# Patient Record
Sex: Male | Born: 1975
Health system: Southern US, Community
[De-identification: ages and names within clinical notes are randomized; demographics above are authoritative.]

## PROBLEM LIST (undated history)

## (undated) DIAGNOSIS — Z789 Other specified health status: Secondary | ICD-10-CM

## (undated) DIAGNOSIS — K259 Gastric ulcer, unspecified as acute or chronic, without hemorrhage or perforation: Secondary | ICD-10-CM

## (undated) HISTORY — PX: OTHER SURGICAL HISTORY: SHX169

## (undated) HISTORY — PX: NO PAST SURGERIES: SHX2092

---

## 1999-01-05 ENCOUNTER — Emergency Department (HOSPITAL_COMMUNITY): Admission: EM | Admit: 1999-01-05 | Discharge: 1999-01-05 | Payer: Self-pay | Admitting: Emergency Medicine

## 2002-04-03 ENCOUNTER — Emergency Department (HOSPITAL_COMMUNITY): Admission: EM | Admit: 2002-04-03 | Discharge: 2002-04-04 | Payer: Self-pay | Admitting: Emergency Medicine

## 2006-08-19 ENCOUNTER — Emergency Department (HOSPITAL_COMMUNITY): Admission: EM | Admit: 2006-08-19 | Discharge: 2006-08-20 | Payer: Self-pay | Admitting: Emergency Medicine

## 2007-02-17 ENCOUNTER — Emergency Department (HOSPITAL_COMMUNITY): Admission: EM | Admit: 2007-02-17 | Discharge: 2007-02-17 | Payer: Self-pay | Admitting: Emergency Medicine

## 2007-12-28 ENCOUNTER — Emergency Department (HOSPITAL_COMMUNITY): Admission: EM | Admit: 2007-12-28 | Discharge: 2007-12-28 | Payer: Self-pay | Admitting: Emergency Medicine

## 2008-04-07 ENCOUNTER — Emergency Department (HOSPITAL_COMMUNITY): Admission: EM | Admit: 2008-04-07 | Discharge: 2008-04-07 | Payer: Self-pay | Admitting: Emergency Medicine

## 2011-07-05 LAB — POCT I-STAT, CHEM 8
BUN: 8
Calcium, Ion: 1.17
Chloride: 102
Creatinine, Ser: 1.2
Glucose, Bld: 110 — ABNORMAL HIGH
HCT: 53 — ABNORMAL HIGH
Hemoglobin: 18 — ABNORMAL HIGH
Potassium: 4.5
Sodium: 136
TCO2: 28

## 2012-11-04 ENCOUNTER — Encounter (HOSPITAL_COMMUNITY): Payer: Self-pay | Admitting: *Deleted

## 2012-11-04 ENCOUNTER — Emergency Department (HOSPITAL_COMMUNITY)
Admission: EM | Admit: 2012-11-04 | Discharge: 2012-11-04 | Disposition: A | Discharge status: Home / Self Care | Payer: Self-pay | Attending: Emergency Medicine | Admitting: Emergency Medicine

## 2012-11-04 DIAGNOSIS — K0889 Other specified disorders of teeth and supporting structures: Secondary | ICD-10-CM

## 2012-11-04 DIAGNOSIS — K089 Disorder of teeth and supporting structures, unspecified: Secondary | ICD-10-CM | POA: Insufficient documentation

## 2012-11-04 DIAGNOSIS — F17200 Nicotine dependence, unspecified, uncomplicated: Secondary | ICD-10-CM | POA: Insufficient documentation

## 2012-11-04 DIAGNOSIS — K029 Dental caries, unspecified: Secondary | ICD-10-CM | POA: Insufficient documentation

## 2012-11-04 MED ORDER — OXYCODONE-ACETAMINOPHEN 5-325 MG PO TABS: 1.0000 | ORAL_TABLET | Freq: Four times a day (QID) | ORAL | Status: DC | PRN

## 2012-11-04 NOTE — ED Provider Notes (Signed)
History  Scribed for Aaron Gourd, PA-C/ Juliet Rude. Pickering, MD, the patient was seen in room WTR7/WTR7. This chart was scribed by Candelaria Stagers. The patient's care started at 3:19 PM   CSN: [id number]  Arrival date & time 11/04/12  1438   First MD Initiated Contact with Patient 11/04/12 1513      Chief Complaint  Patient presents with  . Dental Pain     The history is provided by the patient. No language interpreter was used.   Aaron Walker is a 37 y.o. male who presents to the Emergency Department complaining of sudden onset of upper right dental pain that started yesterday and has gotten worse today. He describes the pain as a throbbing pain rated 10/10.  He denies fever, chills, nausea, or vomiting.  Pt is currently shaking which he reports is due to the pain.  Tried taking advil with only temporary relief yesterday. Gargling with water provides short relief.  Cold air makes the pain worse.  Pt does not have a dentist.       History reviewed. No pertinent past medical history.  Past Surgical History  Procedure Date  . None     History reviewed. No pertinent family history.  History  Substance Use Topics  . Smoking status: Current Every Day Smoker -- 0.5 packs/day    Types: Cigarettes  . Smokeless tobacco: Never Used  . Alcohol Use: Yes     Comment: weekends      Review of Systems  Constitutional: Negative for fever and chills.  HENT: Positive for dental problem (right upper dental pain).   Respiratory: Negative for shortness of breath.   Cardiovascular: Negative for chest pain and leg swelling.  Gastrointestinal: Negative for nausea and vomiting.  Genitourinary: Negative for dysuria and difficulty urinating.  Neurological: Negative for weakness.  All other systems reviewed and are negative.    Allergies  Review of patient's allergies indicates no known allergies.  Home Medications  No current outpatient prescriptions on file.  BP 179/87  Pulse 122   Temp 98.1 F (36.7 C) (Oral)  Resp 20  Wt 130 lb (58.968 kg)  SpO2 100%  Physical Exam  Nursing note and vitals reviewed. Constitutional: He is oriented to person, place, and time. He appears well-developed and well-nourished. No distress.  HENT:  Head: Normocephalic and atraumatic.  Right Ear: Tympanic membrane normal.  Left Ear: Tympanic membrane normal.  Mouth/Throat: Uvula is midline and oropharynx is clear and moist. Abnormal dentition. Dental caries present. No dental abscesses.       Right facial tenderness on palpation. Poor dentition.  No appreciable decay, mild cavity to the right upper last molar. No evidence of abscess.  Eyes: Conjunctivae normal and EOM are normal.  Neck: Normal range of motion. Neck supple. No tracheal deviation present.  Cardiovascular: Normal rate, regular rhythm and normal heart sounds.   Pulmonary/Chest: Effort normal and breath sounds normal. No respiratory distress. He has no wheezes. He has no rales.  Abdominal: Soft. Bowel sounds are normal. There is no tenderness.  Musculoskeletal: Normal range of motion.  Lymphadenopathy:       Head (right side): No submental, no submandibular and no tonsillar adenopathy present.       Head (left side): No submental, no submandibular and no tonsillar adenopathy present.  Neurological: He is alert and oriented to person, place, and time.  Skin: Skin is warm and dry.  Psychiatric: His behavior is normal. His mood appears anxious.  [facility]    ED Course  Procedures   Dental block. 1.5 cc 1% lidocaine into right upper gingiva.  DIAGNOSTIC STUDIES: Oxygen Saturation is 100% on room air, normal by my interpretation.    COORDINATION OF CARE:  3:28 PM Will administer nerve block to the right upper molar.  Advised pt to follow up with a dentist.  Pt understands and agrees.  4:15PM Dental Block administered.  Pt tolerated the procedure with no problems.    Labs Reviewed - No data to display No results  found.   1. Pain, dental       MDM  37 y/o male with dental pain without infection. Dental block administered. Rx percocet #10. Number for dental f/u given. Return precautions discussed. Patient states understanding of plan and is agreeable.   I personally performed the services described in this documentation, which was scribed in my presence. The recorded information has been reviewed and is accurate.        Trevor Mace, PA-C 11/04/12 1624

## 2012-11-04 NOTE — ED Notes (Signed)
Pt from home with reports of dental pain in the right upper area that radiates to right neck that started yesterday.

## 2012-11-04 NOTE — ED Provider Notes (Signed)
Medical screening examination/treatment/procedure(s) were performed by non-physician practitioner and as supervising physician I was immediately available for consultation/collaboration.  Quindarius Cabello R. Sloan Takagi, MD 11/04/12 2221 

## 2017-01-20 ENCOUNTER — Emergency Department (HOSPITAL_COMMUNITY): Payer: Self-pay

## 2017-01-20 ENCOUNTER — Inpatient Hospital Stay (HOSPITAL_COMMUNITY)
Admission: EM | Admit: 2017-01-20 | Discharge: 2017-01-24 | DRG: 378 | Disposition: A | Discharge status: Home / Self Care | Payer: Self-pay | Attending: Internal Medicine | Admitting: Internal Medicine

## 2017-01-20 ENCOUNTER — Encounter (HOSPITAL_COMMUNITY): Payer: Self-pay | Admitting: *Deleted

## 2017-01-20 DIAGNOSIS — K922 Gastrointestinal hemorrhage, unspecified: Secondary | ICD-10-CM | POA: Diagnosis present

## 2017-01-20 DIAGNOSIS — R000 Tachycardia, unspecified: Secondary | ICD-10-CM | POA: Diagnosis present

## 2017-01-20 DIAGNOSIS — K0889 Other specified disorders of teeth and supporting structures: Secondary | ICD-10-CM | POA: Diagnosis present

## 2017-01-20 DIAGNOSIS — Z7982 Long term (current) use of aspirin: Secondary | ICD-10-CM

## 2017-01-20 DIAGNOSIS — G8929 Other chronic pain: Secondary | ICD-10-CM | POA: Diagnosis present

## 2017-01-20 DIAGNOSIS — D649 Anemia, unspecified: Secondary | ICD-10-CM

## 2017-01-20 DIAGNOSIS — Y92002 Bathroom of unspecified non-institutional (private) residence single-family (private) house as the place of occurrence of the external cause: Secondary | ICD-10-CM

## 2017-01-20 DIAGNOSIS — K920 Hematemesis: Secondary | ICD-10-CM | POA: Diagnosis present

## 2017-01-20 DIAGNOSIS — B9681 Helicobacter pylori [H. pylori] as the cause of diseases classified elsewhere: Secondary | ICD-10-CM | POA: Diagnosis present

## 2017-01-20 DIAGNOSIS — F17210 Nicotine dependence, cigarettes, uncomplicated: Secondary | ICD-10-CM | POA: Diagnosis present

## 2017-01-20 DIAGNOSIS — W19XXXA Unspecified fall, initial encounter: Secondary | ICD-10-CM | POA: Diagnosis present

## 2017-01-20 DIAGNOSIS — K2970 Gastritis, unspecified, without bleeding: Secondary | ICD-10-CM | POA: Diagnosis present

## 2017-01-20 DIAGNOSIS — R739 Hyperglycemia, unspecified: Secondary | ICD-10-CM | POA: Diagnosis present

## 2017-01-20 DIAGNOSIS — R61 Generalized hyperhidrosis: Secondary | ICD-10-CM | POA: Diagnosis present

## 2017-01-20 DIAGNOSIS — Z791 Long term (current) use of non-steroidal anti-inflammatories (NSAID): Secondary | ICD-10-CM

## 2017-01-20 DIAGNOSIS — R55 Syncope and collapse: Secondary | ICD-10-CM | POA: Diagnosis present

## 2017-01-20 DIAGNOSIS — K264 Chronic or unspecified duodenal ulcer with hemorrhage: Principal | ICD-10-CM | POA: Diagnosis present

## 2017-01-20 DIAGNOSIS — K921 Melena: Secondary | ICD-10-CM | POA: Diagnosis present

## 2017-01-20 DIAGNOSIS — Z833 Family history of diabetes mellitus: Secondary | ICD-10-CM

## 2017-01-20 DIAGNOSIS — F1010 Alcohol abuse, uncomplicated: Secondary | ICD-10-CM | POA: Diagnosis present

## 2017-01-20 DIAGNOSIS — D62 Acute posthemorrhagic anemia: Secondary | ICD-10-CM | POA: Diagnosis present

## 2017-01-20 HISTORY — DX: Other specified health status: Z78.9

## 2017-01-20 LAB — CBC WITH DIFFERENTIAL/PLATELET
Basophils Absolute: 0 10*3/uL (ref 0.0–0.1)
Basophils Relative: 0 %
Eosinophils Absolute: 0 10*3/uL (ref 0.0–0.7)
Eosinophils Relative: 0 %
HCT: 20.3 % — ABNORMAL LOW (ref 39.0–52.0)
Hemoglobin: 7.1 g/dL — ABNORMAL LOW (ref 13.0–17.0)
Lymphocytes Relative: 23 %
Lymphs Abs: 2.6 10*3/uL (ref 0.7–4.0)
MCH: 29.5 pg (ref 26.0–34.0)
MCHC: 35 g/dL (ref 30.0–36.0)
MCV: 84.2 fL (ref 78.0–100.0)
Monocytes Absolute: 0.9 10*3/uL (ref 0.1–1.0)
Monocytes Relative: 8 %
Neutro Abs: 8 10*3/uL — ABNORMAL HIGH (ref 1.7–7.7)
Neutrophils Relative %: 69 %
Platelets: 156 10*3/uL (ref 150–400)
RBC: 2.41 MIL/uL — ABNORMAL LOW (ref 4.22–5.81)
RDW: 13 % (ref 11.5–15.5)
WBC: 11.6 10*3/uL — ABNORMAL HIGH (ref 4.0–10.5)

## 2017-01-20 LAB — COMPREHENSIVE METABOLIC PANEL
ALT: 23 U/L (ref 17–63)
AST: 24 U/L (ref 15–41)
Albumin: 2.7 g/dL — ABNORMAL LOW (ref 3.5–5.0)
Alkaline Phosphatase: 48 U/L (ref 38–126)
Anion gap: 10 (ref 5–15)
BUN: 32 mg/dL — ABNORMAL HIGH (ref 6–20)
CO2: 21 mmol/L — ABNORMAL LOW (ref 22–32)
Calcium: 8.3 mg/dL — ABNORMAL LOW (ref 8.9–10.3)
Chloride: 101 mmol/L (ref 101–111)
Creatinine, Ser: 0.83 mg/dL (ref 0.61–1.24)
GFR calc Af Amer: >60 mL/min (ref >60)
GFR calc non Af Amer: >60 mL/min (ref >60)
Glucose, Bld: 244 mg/dL — ABNORMAL HIGH (ref 65–99)
Potassium: 4.9 mmol/L (ref 3.5–5.1)
Sodium: 132 mmol/L — ABNORMAL LOW (ref 135–145)
Total Bilirubin: 1.1 mg/dL (ref 0.3–1.2)
Total Protein: 5.1 g/dL — ABNORMAL LOW (ref 6.5–8.1)

## 2017-01-20 LAB — CBG MONITORING, ED: Glucose-Capillary: 146 mg/dL — ABNORMAL HIGH (ref 65–99)

## 2017-01-20 LAB — APTT: aPTT: 28 seconds (ref 24–36)

## 2017-01-20 LAB — CBC
HCT: 26.7 % — ABNORMAL LOW (ref 39.0–52.0)
Hemoglobin: 9.4 g/dL — ABNORMAL LOW (ref 13.0–17.0)
MCH: 29.5 pg (ref 26.0–34.0)
MCHC: 35.2 g/dL (ref 30.0–36.0)
MCV: 83.7 fL (ref 78.0–100.0)
Platelets: 145 10*3/uL — ABNORMAL LOW (ref 150–400)
RBC: 3.19 MIL/uL — ABNORMAL LOW (ref 4.22–5.81)
RDW: 13.9 % (ref 11.5–15.5)
WBC: 12.1 10*3/uL — ABNORMAL HIGH (ref 4.0–10.5)

## 2017-01-20 LAB — PROTIME-INR
INR: 1.28
Prothrombin Time: 16.1 seconds — ABNORMAL HIGH (ref 11.4–15.2)

## 2017-01-20 LAB — TROPONIN I
Troponin I: <0.03 ng/mL (ref <0.03)
Troponin I: <0.03 ng/mL (ref <0.03)
Troponin I: <0.03 ng/mL (ref <0.03)

## 2017-01-20 LAB — PREPARE RBC (CROSSMATCH)

## 2017-01-20 LAB — POC OCCULT BLOOD, ED: Fecal Occult Bld: POSITIVE — AB

## 2017-01-20 LAB — ABO/RH: ABO/RH(D): B POS

## 2017-01-20 MED ORDER — ONDANSETRON HCL 4 MG/2ML IJ SOLN
4.0000 mg | Freq: Four times a day (QID) | INTRAMUSCULAR | Status: DC | PRN
  Administered 2017-01-20: 4 mg via INTRAVENOUS
  Filled 2017-01-20: qty 2

## 2017-01-20 MED ORDER — SODIUM CHLORIDE 0.9 % IV BOLUS (SEPSIS): 1000.0000 mL | Freq: Once | INTRAVENOUS | Status: AC

## 2017-01-20 MED ORDER — LORAZEPAM 2 MG/ML IJ SOLN: 1.0000 mg | Freq: Four times a day (QID) | INTRAMUSCULAR | Status: AC | PRN

## 2017-01-20 MED ORDER — SODIUM CHLORIDE 0.9 % IV SOLN
50.0000 ug/h | INTRAVENOUS | Status: DC
  Administered 2017-01-20: 50 ug/h via INTRAVENOUS
  Filled 2017-01-20 (×3): qty 1

## 2017-01-20 MED ORDER — PANTOPRAZOLE SODIUM 40 MG IV SOLR
40.0000 mg | Freq: Once | INTRAVENOUS | Status: AC
  Administered 2017-01-20: 40 mg via INTRAVENOUS
  Filled 2017-01-20: qty 40

## 2017-01-20 MED ORDER — SODIUM CHLORIDE 0.9 % IV SOLN: Freq: Once | INTRAVENOUS | Status: DC

## 2017-01-20 MED ORDER — VITAMIN B-1 100 MG PO TABS
100.0000 mg | ORAL_TABLET | Freq: Every day | ORAL | Status: DC
  Administered 2017-01-20 – 2017-01-24 (×4): 100 mg via ORAL
  Filled 2017-01-20 (×4): qty 1

## 2017-01-20 MED ORDER — LORAZEPAM 1 MG PO TABS
1.0000 mg | ORAL_TABLET | Freq: Four times a day (QID) | ORAL | Status: AC | PRN
  Administered 2017-01-21 – 2017-01-22 (×2): 1 mg via ORAL
  Filled 2017-01-20 (×2): qty 1

## 2017-01-20 MED ORDER — OCTREOTIDE LOAD VIA INFUSION
50.0000 ug | Freq: Once | INTRAVENOUS | Status: AC
  Administered 2017-01-20: 50 ug via INTRAVENOUS
  Filled 2017-01-20: qty 25

## 2017-01-20 MED ORDER — THIAMINE HCL 100 MG/ML IJ SOLN: 100.0000 mg | Freq: Every day | INTRAMUSCULAR | Status: DC

## 2017-01-20 MED ORDER — SODIUM CHLORIDE 0.9 % IV SOLN
INTRAVENOUS | Status: AC
  Administered 2017-01-20 (×2): via INTRAVENOUS
  Administered 2017-01-21: 1 mL via INTRAVENOUS

## 2017-01-20 MED ORDER — ADULT MULTIVITAMIN W/MINERALS CH
1.0000 | ORAL_TABLET | Freq: Every day | ORAL | Status: DC
  Administered 2017-01-20 – 2017-01-24 (×4): 1 via ORAL
  Filled 2017-01-20 (×4): qty 1

## 2017-01-20 MED ORDER — PANTOPRAZOLE SODIUM 40 MG IV SOLR
40.0000 mg | Freq: Two times a day (BID) | INTRAVENOUS | Status: DC
  Administered 2017-01-20 – 2017-01-23 (×7): 40 mg via INTRAVENOUS
  Filled 2017-01-20 (×7): qty 40

## 2017-01-20 MED ORDER — ONDANSETRON HCL 4 MG PO TABS: 4.0000 mg | ORAL_TABLET | Freq: Four times a day (QID) | ORAL | Status: DC | PRN

## 2017-01-20 MED ORDER — FOLIC ACID 1 MG PO TABS
1.0000 mg | ORAL_TABLET | Freq: Every day | ORAL | Status: DC
Start: 2017-01-20 — End: 2017-01-24
  Administered 2017-01-20 – 2017-01-24 (×4): 1 mg via ORAL
  Filled 2017-01-20 (×4): qty 1

## 2017-01-20 NOTE — ED Notes (Signed)
Troponin I added to blood in the lab

## 2017-01-20 NOTE — Consult Note (Signed)
UNASSIGNED PATIENT Aaron Walker Reason for Consult: GI bleed-melena, CGE Referring Physician: ER MD  Aaron Walker is an 41 y.o. male.  HPI: 41 year old male with a history of alcohol abuse and NSAID abuse for severe dental pain over the last few months, brought to the ER by EMS, weak and dizzy with diaphoresis after he had dark tarry stools and a near syncopal event.  Patient claims he was in his usual state of health yesterday when he went to work and took some Bayer Aspirin shortly thereafter after he had a large upper bowel movement and went to the bathroom and noticed black tarry stools me he got really weak and had a couple of near syncopal events. He denies any nausea vomiting abdominal pain and chest pain or shortness of breath he was found to be taken on arrival in the ER with slightly hypotensive with a hemoglobin of 7.1 g/dL and therefore GI consultation was requested. He denies a previous history of ulcers jaundice or colitis he's neve he denies any family history r had similar symptoms in the past he drinks two 40 ounce bottles of beer per day and smokes one pack of cigarettes over 2-3 days. He denies a family history of GI disorders.  History reviewed. No pertinent past medical history.  Past Surgical History:  Procedure Laterality Date  . None     No family history on file.  Social History:  reports that he has been smoking Cigarettes.  He has been smoking about 0.50 packs per day. He has never used smokeless tobacco. He reports that he drinks alcohol. He reports that he does not use drugs.  Allergies: No Known Allergies  Medications: I have reviewed the patient's current medications.  Results for orders placed or performed during the hospital encounter of 01/20/17 (from the past 48 hour(s))  CBC with Differential     Status: Abnormal   Collection Time: 01/20/17  5:30 AM  Result Value Ref Range   WBC 11.6 (H) 4.0 - 10.5 K/uL   RBC 2.41 (L) 4.22 - 5.81 MIL/uL   Hemoglobin 7.1 (L) 13.0 - 17.0 g/dL   HCT 20.3 (L) 39.0 - 52.0 %   MCV 84.2 78.0 - 100.0 fL   MCH 29.5 26.0 - 34.0 pg   MCHC 35.0 30.0 - 36.0 g/dL   RDW 13.0 11.5 - 15.5 %   Platelets 156 150 - 400 K/uL   Neutrophils Relative % 69 %   Neutro Abs 8.0 (H) 1.7 - 7.7 K/uL   Lymphocytes Relative 23 %   Lymphs Abs 2.6 0.7 - 4.0 K/uL   Monocytes Relative 8 %   Monocytes Absolute 0.9 0.1 - 1.0 K/uL   Eosinophils Relative 0 %   Eosinophils Absolute 0.0 0.0 - 0.7 K/uL   Basophils Relative 0 %   Basophils Absolute 0.0 0.0 - 0.1 K/uL  Comprehensive metabolic panel     Status: Abnormal   Collection Time: 01/20/17  5:30 AM  Result Value Ref Range   Sodium 132 (L) 135 - 145 mmol/L   Potassium 4.9 3.5 - 5.1 mmol/L   Chloride 101 101 - 111 mmol/L   CO2 21 (L) 22 - 32 mmol/L   Glucose, Bld 244 (H) 65 - 99 mg/dL   BUN 32 (H) 6 - 20 mg/dL   Creatinine, Ser 0.83 0.61 - 1.24 mg/dL   Calcium 8.3 (L) 8.9 - 10.3 mg/dL   Total Protein 5.1 (L) 6.5 - 8.1 g/dL   Albumin 2.7 (  L) 3.5 - 5.0 g/dL   AST 24 15 - 41 U/L   ALT 23 17 - 63 U/L   Alkaline Phosphatase 48 38 - 126 U/L   Total Bilirubin 1.1 0.3 - 1.2 mg/dL   GFR calc non Af Amer >60 >60 mL/min   GFR calc Af Amer >60 >60 mL/min    Comment: (NOTE) The eGFR has been calculated using the CKD EPI equation. This calculation has not been validated in all clinical situations. eGFR's persistently <60 mL/min signify possible Chronic Kidney Disease.    Anion gap 10 5 - 15  Protime-INR     Status: Abnormal   Collection Time: 01/20/17  5:30 AM  Result Value Ref Range   Prothrombin Time 16.1 (H) 11.4 - 15.2 seconds   INR 1.28   APTT     Status: None   Collection Time: 01/20/17  5:30 AM  Result Value Ref Range   aPTT 28 24 - 36 seconds  Type and screen     Status: None (Preliminary result)   Collection Time: 01/20/17  5:30 AM  Result Value Ref Range   ABO/RH(D) B POS    Antibody Screen NEG    Sample Expiration 01/23/2017    Unit Number [id number]     Blood Component Type RED CELLS,LR    Unit division 00    Status of Unit ALLOCATED    Transfusion Status OK TO TRANSFUSE    Crossmatch Result Compatible    Unit Number [id number]    Blood Component Type RED CELLS,LR    Unit division 00    Status of Unit ISSUED    Transfusion Status OK TO TRANSFUSE    Crossmatch Result Compatible   Troponin I     Status: None   Collection Time: 01/20/17  5:30 AM  Result Value Ref Range   Troponin I <0.03 <0.03 ng/mL  ABO/Rh     Status: None (Preliminary result)   Collection Time: 01/20/17  5:30 AM  Result Value Ref Range   ABO/RH(D) B POS   Prepare RBC     Status: None   Collection Time: 01/20/17  6:32 AM  Result Value Ref Range   Order Confirmation ORDER PROCESSED BY BLOOD BANK   POC occult blood, ED     Status: Abnormal   Collection Time: 01/20/17  6:48 AM  Result Value Ref Range   Fecal Occult Bld POSITIVE (A) NEGATIVE  CBG monitoring, ED     Status: Abnormal   Collection Time: 01/20/17  8:06 AM  Result Value Ref Range   Glucose-Capillary 146 (H) 65 - 99 mg/dL    Dg Chest Portable 1 View  Result Date: 01/20/2017 CLINICAL DATA:  Cough for several months and upper GI bleed. EXAM: PORTABLE CHEST 1 VIEW COMPARISON:  02/17/2007 chest radiograph FINDINGS: The cardiomediastinal silhouette is unremarkable. There is no evidence of focal airspace disease, pulmonary edema, suspicious pulmonary nodule/mass, pleural effusion, or pneumothorax. No acute bony abnormalities are identified. IMPRESSION: No active disease. Electronically Signed   By: Margarette Canada M.D.   On: 01/20/2017 07:40   Review of Systems  Constitutional: Positive for diaphoresis and malaise/fatigue. Negative for chills and fever.  Gastrointestinal: Positive for blood in stool, melena, nausea and vomiting.  Genitourinary: Negative.   Skin: Negative.   Neurological: Positive for dizziness and weakness.  Endo/Heme/Allergies: Negative.   Psychiatric/Behavioral: Positive for substance  abuse.   Blood pressure (!) 115/53, pulse (!) 113, temperature 98 F (36.7 C), temperature source Oral, resp. rate 20,  height 5' 7" (1.702 m), weight 65.8 kg (145 lb), SpO2 100 %. Physical Exam  Constitutional: He is oriented to person, place, and time. He appears well-developed and well-nourished.  HENT:  Head: Normocephalic and atraumatic.  He has several caried teeth  Eyes: Conjunctivae and EOM are normal. Pupils are equal, round, and reactive to light.  Neck: Normal range of motion. Neck supple.  Cardiovascular: Normal rate and regular rhythm.   Respiratory: Effort normal and breath sounds normal.  GI: Soft. Bowel sounds are normal.  Musculoskeletal: Normal range of motion.  Neurological: He is alert and oriented to person, place, and time.  Skin: Skin is warm and dry.  Psychiatric: He has a normal mood and affect. His behavior is normal. Judgment and thought content normal.   Assessment/Plan: 1) Melena with and anemia in a 41-year-old male with a history of nonsteroidal and alcohol abuse; rule out alcoholic gastritis versus peptic ulcer disease. EGD planned for tomorrow.  2) Hyperglycemia-check fasting hemoglobin A1c.  3) Poor dentition needs a dental consult    Maeryn Mcgath 01/20/2017, 9:07 AM     

## 2017-01-20 NOTE — ED Provider Notes (Signed)
[facility]-EMERGENCY DEPT Provider Note   CSN: [id number] Arrival date & time: 01/20/17  1191     History   Chief Complaint Chief Complaint  Patient presents with  . Rectal Bleeding    HPI Aaron Walker is a 41 y.o. male.  HPI   41 year old male with hx of alcohol abuse brought here via EMS from home for evaluation of weakness.  Pt is a heavy drinker, last alcohol use was yesterday.  Patient report he usually drinks 1-2 40oz beer daily sometimes more. He has been having recurrent dental pain for the past 4-5 months and admits to taking Bayer aspirin on a recurrent basis. Occasionally he will take ibuprofen. Yesterday after taking the aspirin, later on he noticed black dark tarry stool and did vomit.emesis twice. This morning when using the bathroom he became very lightheadedness, and weak and fell down the ground. He fell down twice, for counseling to the sweat with uncontrollable chills. Patient brought here for further evaluation. Patient also endorsed chronic cough that he has been having for the past 2-3 weeks usually worse at night. Denies having any fever, headache, sore throat, chest pain, abdominal pain, back pain, focal numbness or weakness. He does smoke half a pack of cigarettes daily. Denies any significant significant medical history. Denies any drug use.   History reviewed. No pertinent past medical history.  There are no active problems to display for this patient.   Past Surgical History:  Procedure Laterality Date  . None         Home Medications    Prior to Admission medications   Medication Sig Start Date End Date Taking? Authorizing Provider  oxyCODONE-acetaminophen (PERCOCET) 5-325 MG per tablet Take 1-2 tablets by mouth every 6 (six) hours as needed for pain. 11/04/12   Kathrynn Speed, PA-C    Family History No family history on file.  Social History Social History  Substance Use Topics  . Smoking status: Current Every Day Smoker    Packs/day: 0.50   Types: Cigarettes  . Smokeless tobacco: Never Used  . Alcohol use Yes     Comment: weekends     Allergies   Patient has no known allergies.   Review of Systems Review of Systems  All other systems reviewed and are negative.    Physical Exam Updated Vital Signs BP 108/66   Temp 97.7 F (36.5 C)   Resp 18   Ht  (1.702 m)   Wt 63.5 kg   BMI 21.93 kg/m   Physical Exam  Constitutional: He appears well-developed and well-nourished.  Patient appears to be drowsy, tremulous.  HENT:  Head: Atraumatic.  Mouth/Throat: Oropharynx is clear and moist.  Mouth: Poor dentition throughout  Eyes: Conjunctivae are normal.  Conjunctiva pale  Neck: Neck supple.  Cardiovascular:  Tachycardia without murmur rubs gallops  Pulmonary/Chest:  Mildly tachypneic, no wheezes, rales, rhonchi  Abdominal: Soft. He exhibits no distension. There is no tenderness.  No hepatomegaly  Genitourinary:  Genitourinary Comments: Chaperone present during exam. Normal rectal tone, external hemorrhoid, nonthrombosed. Black tarry stool noted on glove, Hemoccult positive.  Neurological: He is alert.  Skin: No rash noted. There is pallor.  Psychiatric: He has a normal mood and affect.  Nursing note and vitals reviewed.    ED Treatments / Results  Labs (all labs ordered are listed, but only abnormal results are displayed) Labs Reviewed  CBC WITH DIFFERENTIAL/PLATELET - Abnormal; Notable for the following:       Result Value  WBC 11.6 (*)    RBC 2.41 (*)    Hemoglobin 7.1 (*)    HCT 20.3 (*)    Neutro Abs 8.0 (*)    All other components within normal limits  COMPREHENSIVE METABOLIC PANEL - Abnormal; Notable for the following:    Sodium 132 (*)    CO2 21 (*)    Glucose, Bld 244 (*)    BUN 32 (*)    Calcium 8.3 (*)    Total Protein 5.1 (*)    Albumin 2.7 (*)    All other components within normal limits  PROTIME-INR - Abnormal; Notable for the following:    Prothrombin Time 16.1 (*)    All  other components within normal limits  POC OCCULT BLOOD, ED - Abnormal; Notable for the following:    Fecal Occult Bld POSITIVE (*)    All other components within normal limits  CBG MONITORING, ED - Abnormal; Notable for the following:    Glucose-Capillary 146 (*)    All other components within normal limits  APTT  TROPONIN I  TROPONIN I  TROPONIN I  HIV ANTIBODY (ROUTINE TESTING)  HEPATITIS C ANTIBODY (REFLEX)  TYPE AND SCREEN  PREPARE RBC (CROSSMATCH)  ABO/RH    EKG  EKG Interpretation  Date/Time:  Sunday January 20 2017 05:51:20 EDT Ventricular Rate:  116 PR Interval:    QRS Duration: 61 QT Interval:  315 QTC Calculation: 438 R Axis:   76 Text Interpretation:  Sinus tachycardia Borderline ST elevation, anterior leads Lateral leads are also involved Confirmed by Rubin Payor  MD, NATHAN [phone]) on 01/20/2017 5:57:25 AM       Radiology Dg Chest Portable 1 View  Result Date: 01/20/2017 CLINICAL DATA:  Cough for several months and upper GI bleed. EXAM: PORTABLE CHEST 1 VIEW COMPARISON:  02/17/2007 chest radiograph FINDINGS: The cardiomediastinal silhouette is unremarkable. There is no evidence of focal airspace disease, pulmonary edema, suspicious pulmonary nodule/mass, pleural effusion, or pneumothorax. No acute bony abnormalities are identified. IMPRESSION: No active disease. Electronically Signed   By: Harmon Pier M.D.   On: 01/20/2017 07:40    Procedures Procedures (including critical care time)  Medications Ordered in ED Medications  octreotide (SANDOSTATIN) 2 mcg/mL load via infusion 50 mcg (50 mcg Intravenous Bolus from Bag 01/20/17 0716)    And  octreotide (SANDOSTATIN) 500 mcg in sodium chloride 0.9 % 250 mL (2 mcg/mL) infusion (50 mcg/hr Intravenous New Bag/Given 01/20/17 0724)  ondansetron (ZOFRAN) tablet 4 mg (not administered)    Or  ondansetron (ZOFRAN) injection 4 mg (not administered)  pantoprazole (PROTONIX) injection 40 mg (not administered)  0.9 %  sodium  chloride infusion ( Intravenous New Bag/Given 01/20/17 0756)  LORazepam (ATIVAN) tablet 1 mg (not administered)    Or  LORazepam (ATIVAN) injection 1 mg (not administered)  thiamine (VITAMIN B-1) tablet 100 mg (not administered)    Or  thiamine (B-1) injection 100 mg (not administered)  folic acid (FOLVITE) tablet 1 mg (not administered)  multivitamin with minerals tablet 1 tablet (not administered)  sodium chloride 0.9 % bolus 1,000 mL (1,000 mLs Intravenous Given by EMS 01/20/17 0643)  pantoprazole (PROTONIX) injection 40 mg (40 mg Intravenous Given 01/20/17 8119)     Initial Impression / Assessment and Plan / ED Course  I have reviewed the triage vital signs and the nursing notes.  Pertinent labs & imaging results that were available during my care of the patient were reviewed by me and considered in my medical decision making (see chart  for details).     BP (!) 118/55   Pulse (!) 107   Temp 98.4 F (36.9 C) (Oral)   Resp (!) 21   Ht  (1.702 m)   Wt 63.5 kg   SpO2 100%   BMI 21.93 kg/m    Final Clinical Impressions(s) / ED Diagnoses   Final diagnoses:  Upper GI bleed  Symptomatic anemia    New Prescriptions New Prescriptions   No medications on file   6:43 AM Pt with hx of alcohol abuse and regular NSAIDs use presents with generalized weakness, with new onset of black tarry stool concerning for upper GI bleed.  Did vomit dark emesis "similar to my stool", therefore concern for variceal bleed vs. PUD.  He is tachycardic, tremulous and pale.  BP is 120s systolic.  Current Hgb 7.1.  IVF given, will initiate blood transfusion, give protonix, and octreotide and will consult GI for further management.  Care discussed with Dr. Rubin Payor.    7:25 AM BP improves with IVF.  Blood product started, Pt given PPI and octreotide for his upper GI bleed.  Appreciate consultation from oncall GI specialist Dr. Loreta Ave who request medicine admission, keep pt NPO for endoscopy and  continue with current management.    Appreciate consultation from Internal Medicine Resident who will see pt in the ER and will admit for further care.  Pt will be monitor closely.   8:14 AM ECG initially has acute MI from computer reading. This is likely inaccurate as it shows J-point elevation and pt denies active CP.  I did discussed this with Dr. Rubin Payor.  Trop negative, CXR unremarkable.    CRITICAL CARE Performed by: Fayrene Helper Total critical care time: 45 minutes Critical care time was exclusive of separately billable procedures and treating other patients. Critical care was necessary to treat or prevent imminent or life-threatening deterioration. Critical care was time spent personally by me on the following activities: development of treatment plan with patient and/or surrogate as well as nursing, discussions with consultants, evaluation of patient's response to treatment, examination of patient, obtaining history from patient or surrogate, ordering and performing treatments and interventions, ordering and review of laboratory studies, ordering and review of radiographic studies, pulse oximetry and re-evaluation of patient's condition.    Fayrene Helper, PA-C 01/20/17 4540    Benjiman Core, MD 01/24/17 [phone]

## 2017-01-20 NOTE — H&P (Signed)
Date: 01/20/2017               Patient Name:  Aaron Walker MRN: [id number]  DOB: Oct 23, 1975 Age / Sex: 41 y.o., male   PCP: No Pcp Per Patient         Medical Service: Internal Medicine Teaching Service         Attending Physician: Dr. Benjiman Core, MD    First Contact: Dr. Reymundo Poll Pager: [phone]  Second Contact: Dr. Darreld Mclean Pager: [phone]       After Hours (After 5p/  First Contact Pager: [phone]  weekends / holidays): Second Contact Pager: [phone]   Chief Complaint: GI bleed  History of Present Illness: Patient is a 41 year old male with no significant past medical history who presents today with complaint of dark black and tarry stool. Patient reports he has chronic dental pain and has been taking Bayer's Aspirin and Ibuprofen on a regular basis for the past few months. He was in his usual state of health until yesterday when he took a Bayer's aspirin at work. Shortly after, he went to the bathroom and had a BM with very black stool. He subsequently felt lightheaded and went home to lay down. He felt extremely "hot and sweaty" and weak all over. He says his legs gave out on him on two occasions causing him to fall. He had 2 more bowel movements with melenotic appearing stool and one episode of dark black emesis. He came into the ED for evaluation. He denies chest pain and SOB.   On arrival to the ED, he was tachycardic with HR 110s, BP 108/66, RR 16, oxygen 100% on RA and temperature 97.7. CBC was notable for hemoglobin of 7.1 (last CBC in our records was 8 years ago - hemoglobin at that time was 18). CMP was notable for sodium 132, creatinine 0.83, and glucose 244. Troponin < 0.03. Fecal occult blood test was positive.   Meds:  No outpatient prescriptions have been marked as taking for the 01/20/17 encounter [facility] Encounter).     Allergies: Allergies as of 01/20/2017  . (No Known Allergies)   History reviewed. No pertinent past medical  history.  Family History: DM on mom's side, denies family history of colon or gastric cancer.   Social History: Lives in [place]. Works as a Financial risk analyst. Drinks 1-2 40 oz beers a day or more. Smokes 1-2 PPD x 23 years. Denies illicit drug use.   Review of Systems: A complete ROS was negative except as per HPI.   Physical Exam: Blood pressure 116/65, pulse (!) 105, temperature 97.7 F (36.5 C), resp. rate (!) 32, height  (1.702 m), weight 140 lb (63.5 kg), SpO2 100 %. Constitutional: NAD, appears comfortable HEENT: Atraumatic, normocephalic. PERRL, anicteric sclera.  Neck: Supple, trachea midline.  Cardiovascular: Tachycardic but regular rhythm, no murmurs, rubs, or gallops.  Pulmonary/Chest: CTAB, no wheezes, rales, or rhonchi.  Abdominal: Soft, non tender, non distended. +BS.  Extremities: Warm and well perfused. Distal pulses intact. No edema.  Neurological: A&Ox3, CN II - XII grossly intact.  Skin: No rashes or erythema  Psychiatric: Normal mood and affect   EKG: Personally reviewed. Sinus tachycardia. J point elevation in V3. J waves and early repolarization abnormalities in V4, V5, V6.   CXR: Personally reviewed. Negative CXR.   Assessment & Plan by Problem:  Patient is a 41 year old male with history of daily alcohol and NSAID use who presents with symptomatic anemia after 3 episodes of melena  and 1 episode of coffee ground emesis concerning for upper GI bleed.   GI Bleed: In the setting of daily alcohol and NSAID use. Patient presented with symptomatic anemia (lightheadedness, generalized weakness) after new onset melena and coffee ground emesis. Overall concerning for an upper GI bleed, likely PUD vs gastritis. He is tachycardic with HR 110s but hemodynamically stable. Hemoglobin 7.1. GI consulted per EDP, started IV protonix and IV Octreotide. Final recs pending.  -- GI consult, appreciate recommendations -- NPO for now  -- 2 units pRBCs -- F/u post transfusion H&H --  Continue IV Protonix -- Continue IV Octreotide gtt  -- NS @ 100 cc/hr   Hx Alcohol Abuse: Patient drinks at least 1-2 40 oz beers a day and gets the "shakes" when he does not drink. He denies ever experiencing a withdrawal seizure.  -- CIWA protocol  -- Folic acid, thiamine, multivitamin daily   FEN: 100 cc/hr, replete lytes prn, NPO pending GI plans   VTE ppx: SCDs  Code Status: FULL  Dispo: Admit patient to Observation with expected length of stay less than 2 midnights.  Signed: Reymundo Poll, MD 01/20/2017, 7:25 AM  Pager: [phone]

## 2017-01-20 NOTE — ED Triage Notes (Signed)
The pt arrived by gems from home the pt is a heavy drinker  He drank alcohol yesterday.  c/o weakness dark stools  Loose stools today.  Ems was called because of his weakness.  When ems arrived the pt was pale cool and diaphorectic and weak  Iv per ems  bolkused with of nss cbg 246.  His mother told ems she thinks he is detxxing from alcohol alert oriented

## 2017-01-20 NOTE — ED Notes (Signed)
The  Pt denies pain anywhere.  He has not eaten today only one muffin.  No previous history

## 2017-01-21 ENCOUNTER — Inpatient Hospital Stay (HOSPITAL_COMMUNITY): Payer: Self-pay | Admitting: Certified Registered Nurse Anesthetist

## 2017-01-21 ENCOUNTER — Encounter (HOSPITAL_COMMUNITY)
Admission: EM | Disposition: A | Discharge status: Home / Self Care | Payer: Self-pay | Source: Home / Self Care | Attending: Internal Medicine

## 2017-01-21 ENCOUNTER — Encounter (HOSPITAL_COMMUNITY): Payer: Self-pay | Admitting: *Deleted

## 2017-01-21 DIAGNOSIS — K264 Chronic or unspecified duodenal ulcer with hemorrhage: Principal | ICD-10-CM

## 2017-01-21 DIAGNOSIS — K2970 Gastritis, unspecified, without bleeding: Secondary | ICD-10-CM

## 2017-01-21 DIAGNOSIS — K920 Hematemesis: Secondary | ICD-10-CM

## 2017-01-21 DIAGNOSIS — K921 Melena: Secondary | ICD-10-CM

## 2017-01-21 DIAGNOSIS — K922 Gastrointestinal hemorrhage, unspecified: Secondary | ICD-10-CM

## 2017-01-21 DIAGNOSIS — D649 Anemia, unspecified: Secondary | ICD-10-CM

## 2017-01-21 HISTORY — PX: ESOPHAGOGASTRODUODENOSCOPY: SHX5428

## 2017-01-21 LAB — COMPREHENSIVE METABOLIC PANEL
ALT: 24 U/L (ref 17–63)
AST: 25 U/L (ref 15–41)
Albumin: 2.5 g/dL — ABNORMAL LOW (ref 3.5–5.0)
Alkaline Phosphatase: 37 U/L — ABNORMAL LOW (ref 38–126)
Anion gap: 6 (ref 5–15)
BUN: 15 mg/dL (ref 6–20)
CO2: 21 mmol/L — ABNORMAL LOW (ref 22–32)
Calcium: 8.2 mg/dL — ABNORMAL LOW (ref 8.9–10.3)
Chloride: 108 mmol/L (ref 101–111)
Creatinine, Ser: 0.78 mg/dL (ref 0.61–1.24)
GFR calc Af Amer: >60 mL/min (ref >60)
GFR calc non Af Amer: >60 mL/min (ref >60)
Glucose, Bld: 93 mg/dL (ref 65–99)
Potassium: 4.1 mmol/L (ref 3.5–5.1)
Sodium: 135 mmol/L (ref 135–145)
Total Bilirubin: 0.7 mg/dL (ref 0.3–1.2)
Total Protein: 4.7 g/dL — ABNORMAL LOW (ref 6.5–8.1)

## 2017-01-21 LAB — GLUCOSE, CAPILLARY: Glucose-Capillary: 108 mg/dL — ABNORMAL HIGH (ref 65–99)

## 2017-01-21 LAB — HEPATITIS C ANTIBODY (REFLEX): HCV Ab: <0.1 s/co ratio (ref 0.0–0.9)

## 2017-01-21 LAB — HEMOGLOBIN AND HEMATOCRIT, BLOOD
HCT: 24.6 % — ABNORMAL LOW (ref 39.0–52.0)
Hemoglobin: 8.6 g/dL — ABNORMAL LOW (ref 13.0–17.0)

## 2017-01-21 LAB — PREPARE RBC (CROSSMATCH)

## 2017-01-21 LAB — CBC
HCT: 21.5 % — ABNORMAL LOW (ref 39.0–52.0)
Hemoglobin: 7.7 g/dL — ABNORMAL LOW (ref 13.0–17.0)
MCH: 30.2 pg (ref 26.0–34.0)
MCHC: 35.8 g/dL (ref 30.0–36.0)
MCV: 84.3 fL (ref 78.0–100.0)
Platelets: 122 10*3/uL — ABNORMAL LOW (ref 150–400)
RBC: 2.55 MIL/uL — ABNORMAL LOW (ref 4.22–5.81)
RDW: 14.8 % (ref 11.5–15.5)
WBC: 9 10*3/uL (ref 4.0–10.5)

## 2017-01-21 LAB — HCV COMMENT:

## 2017-01-21 LAB — HIV ANTIBODY (ROUTINE TESTING W REFLEX): HIV Screen 4th Generation wRfx: NONREACTIVE

## 2017-01-21 SURGERY — EGD (ESOPHAGOGASTRODUODENOSCOPY)
Anesthesia: General

## 2017-01-21 MED ORDER — PROPOFOL 10 MG/ML IV BOLUS
INTRAVENOUS | Status: DC | PRN
Start: 2017-01-21 — End: 2017-01-21
  Administered 2017-01-21: 140 mg via INTRAVENOUS
  Administered 2017-01-21 (×3): 20 mg via INTRAVENOUS

## 2017-01-21 MED ORDER — SODIUM CHLORIDE 0.9 % IV SOLN: Freq: Once | INTRAVENOUS | Status: DC

## 2017-01-21 MED ORDER — SODIUM CHLORIDE 0.9 % IV SOLN: INTRAVENOUS | Status: DC

## 2017-01-21 MED ORDER — FENTANYL CITRATE (PF) 250 MCG/5ML IJ SOLN
INTRAMUSCULAR | Status: DC | PRN
  Administered 2017-01-21 (×2): 25 ug via INTRAVENOUS

## 2017-01-21 MED ORDER — PROPOFOL 500 MG/50ML IV EMUL
INTRAVENOUS | Status: DC | PRN
  Administered 2017-01-21: 12:00:00 via INTRAVENOUS
  Administered 2017-01-21: 125 ug/kg/min via INTRAVENOUS

## 2017-01-21 MED ORDER — BUTAMBEN-TETRACAINE-BENZOCAINE 2-2-14 % EX AERO
INHALATION_SPRAY | CUTANEOUS | Status: DC | PRN
  Administered 2017-01-21: 2 via TOPICAL

## 2017-01-21 MED ORDER — ONDANSETRON HCL 4 MG/2ML IJ SOLN
INTRAMUSCULAR | Status: DC | PRN
  Administered 2017-01-21: 4 mg via INTRAVENOUS

## 2017-01-21 MED ORDER — GLUCAGON HCL RDNA (DIAGNOSTIC) 1 MG IJ SOLR
INTRAMUSCULAR | Status: DC | PRN
  Administered 2017-01-21: .5 mg via INTRAVENOUS

## 2017-01-21 MED ORDER — SUCCINYLCHOLINE CHLORIDE 20 MG/ML IJ SOLN
INTRAMUSCULAR | Status: DC | PRN
  Administered 2017-01-21: 120 mg via INTRAVENOUS

## 2017-01-21 MED ORDER — GLUCAGON HCL RDNA (DIAGNOSTIC) 1 MG IJ SOLR
INTRAMUSCULAR | Status: AC
  Filled 2017-01-21: qty 1

## 2017-01-21 MED ORDER — SODIUM CHLORIDE 0.9 % IV SOLN
INTRAVENOUS | Status: DC | PRN
  Administered 2017-01-21 (×2): via INTRAVENOUS

## 2017-01-21 MED ORDER — LIDOCAINE HCL (CARDIAC) 20 MG/ML IV SOLN
INTRAVENOUS | Status: DC | PRN
  Administered 2017-01-21: 60 mg via INTRAVENOUS
  Administered 2017-01-21: 40 mg via INTRAVENOUS

## 2017-01-21 NOTE — Progress Notes (Signed)
Patient transferred to endoscopy. Per endo nurse they will be the one to hang the blood, blood given to Weston Settle, RN

## 2017-01-21 NOTE — Progress Notes (Signed)
   Subjective: Patient reports that his lightheadedness and dizziness has improved today. He reports once bloody BM yesterday and one small, dark BM this morning. He received 2 units of pRBCs yesterday. Hemoglobin came 7.1 -> 9.4 yesterday but dropped to 7.7 this morning. Planning for EGD today.    Objective:  Vital signs in last 24 hours: Vitals:   01/21/17 1027 01/21/17 1054 01/21/17 1112 01/21/17 1125  BP: 120/66 118/64 (!) 101/51   Pulse: (!) 101 97 (!) 101   Resp: 18 (!) 22 (!) 22   Temp: 99.1 F (37.3 C) 98.3 F (36.8 C) 99 F (37.2 C) 98.7 F (37.1 C)  TempSrc: Oral Oral Oral Oral  SpO2: 100% 100% 100%   Weight:      Height:       Physical Exam Constitutional: NAD, appears comfortable HEENT: Atraumatic, normocephalic. PERRL, anicteric sclera.  Neck: Supple, trachea midline.  Cardiovascular: Tachycardic but regular rhythm, no murmurs, rubs, or gallops.  Pulmonary/Chest: CTAB, no wheezes, rales, or rhonchi.  Abdominal: Soft, non tender, non distended. +BS.  Extremities: Warm and well perfused. Distal pulses intact. No edema.  Neurological: A&Ox3, CN II - XII grossly intact.  Skin: No rashes or erythema  Psychiatric: Normal mood and affect    Assessment/Plan:  Patient is a 41 year old male with history of daily alcohol and NSAID use who presents with symptomatic anemia after 3 episodes of melena and 1 episode of coffee ground emesis concerning for upper GI bleed.   GI Bleed: In the setting of daily alcohol and NSAID use. Patient presented with symptomatic anemia (lightheadedness, generalized weakness) after new onset melena and coffee ground emesis. Overall concerning for an upper GI bleed, likely PUD vs gastritis. He is persistently tachycardic with HR 110s but hemodynamically stable. Hemoglobin 7.1 on admission. Improved to 9.4 after 2 units of pRBCs on admission. Down to 7.7 this morning. NPO since midnight, planning for EGD today  -- GI consult, appreciate  recommendations -- EGD today, NPO -- S/p 2 units pRBCs on admission -- 1 unit pRBCs today  -- F/u post transfusion H&H -- Continue IV Protonix -- NS @ 100 cc/hr   Hx Alcohol Abuse: Patient drinks at least 1-2 40 oz beers a day and gets the "shakes" when he does not drink. He denies ever experiencing a withdrawal seizure.  -- CIWA protocol  -- Folic acid, thiamine, multivitamin daily   FEN: 100 cc/hr, replete lytes prn, NPO  VTE ppx: SCDs  Code Status: FULL  Dispo: Anticipated discharge in approximately 1-2day(s).   Reymundo Poll, MD 01/21/2017, 11:31 AM Pager: [phone]

## 2017-01-21 NOTE — Anesthesia Procedure Notes (Addendum)
Procedure Name: Intubation Date/Time: 01/21/2017 12:17 PM Performed by: Virgel Gess LEFFEW Pre-anesthesia Checklist: Patient identified, Patient being monitored, Timeout performed, Emergency Drugs available and Suction available Patient Re-evaluated:Patient Re-evaluated prior to inductionOxygen Delivery Method: Circle System Utilized Preoxygenation: Pre-oxygenation with 100% oxygen Intubation Type: IV induction Laryngoscope Size: McGraph and 4 Grade View: Grade I Tube type: Oral Tube size: 7.5 mm Number of attempts: 1 Airway Equipment and Method: Stylet Placement Confirmation: ETT inserted through vocal cords under direct vision,  positive ETCO2 and breath sounds checked- equal and bilateral Secured at: 24 cm Tube secured with: Tape Dental Injury: Teeth and Oropharynx as per pre-operative assessment  Difficulty Due To: Difficulty was anticipated, Difficult Airway- due to dentition and Difficult Airway- due to limited oral opening Future Recommendations: Recommend- induction with short-acting agent, and alternative techniques readily available

## 2017-01-21 NOTE — Anesthesia Preprocedure Evaluation (Addendum)
Anesthesia Evaluation  Patient identified by MRN, date of birth, ID band Patient awake    Reviewed: Allergy & Precautions, NPO status , Patient's Chart, lab work & pertinent test results  Airway Mallampati: II  TM Distance: >3 FB Neck ROM: Full    Dental  (+) Poor Dentition, Missing, Chipped   Pulmonary Current Smoker,    breath sounds clear to auscultation       Cardiovascular  Rhythm:Regular Rate:Normal     Neuro/Psych    GI/Hepatic PUD, (+)     substance abuse  alcohol use, GI bleed   Endo/Other    Renal/GU      Musculoskeletal   Abdominal   Peds  Hematology   Anesthesia Other Findings   Reproductive/Obstetrics                             Anesthesia Physical Anesthesia Plan  ASA: III  Anesthesia Plan: General   Post-op Pain Management:    Induction: Intravenous  Airway Management Planned: Natural Airway, Nasal Cannula and Oral ETT  Additional Equipment:   Intra-op Plan:   Post-operative Plan:   Informed Consent: I have reviewed the patients History and Physical, chart, labs and discussed the procedure including the risks, benefits and alternatives for the proposed anesthesia with the patient or authorized representative who has indicated his/her understanding and acceptance.   Dental advisory given  Plan Discussed with: CRNA  Anesthesia Plan Comments:        Anesthesia Quick Evaluation

## 2017-01-21 NOTE — Transfer of Care (Signed)
Immediate Anesthesia Transfer of Care Note  Patient: Aaron Walker  Procedure(s) Performed: Procedure(s): ESOPHAGOGASTRODUODENOSCOPY (EGD) (N/A)  Patient Location: Endoscopy Unit  Anesthesia Type:General  Level of Consciousness: awake, alert , oriented, patient cooperative and responds to stimulation  Airway & Oxygen Therapy: Patient Spontanous Breathing and Patient connected to nasal cannula oxygen  Post-op Assessment: Report given to RN, Post -op Vital signs reviewed and stable and Patient moving all extremities X 4  Post vital signs: Reviewed and stable  Last Vitals:  Vitals:   01/21/17 1125 01/21/17 1247  BP:  106/77  Pulse:    Resp:  (!) 26  Temp: 37.1 C     Last Pain:  Vitals:   01/21/17 1247  TempSrc: Other (Comment)  PainSc:          Complications: No apparent anesthesia complications

## 2017-01-21 NOTE — H&P (View-Only) (Signed)
UNASSIGNED PATIENT Cross cover [facility] Reason for Consult: GI bleed-melena, CGE Referring Physician: ER MD  Aaron Walker is an 41 y.o. male.  HPI: 41 year old male with a history of alcohol abuse and NSAID abuse for severe dental pain over the last few months, brought to the ER by EMS, weak and dizzy with diaphoresis after he had dark tarry stools and a near syncopal event.  Patient claims he was in his usual state of health yesterday when he went to work and took some Bayer Aspirin shortly thereafter after he had a large upper bowel movement and went to the bathroom and noticed black tarry stools me he got really weak and had a couple of near syncopal events. He denies any nausea vomiting abdominal pain and chest pain or shortness of breath he was found to be taken on arrival in the ER with slightly hypotensive with a hemoglobin of 7.1 g/dL and therefore GI consultation was requested. He denies a previous history of ulcers jaundice or colitis he's neve he denies any family history r had similar symptoms in the past he drinks two 40 ounce bottles of beer per day and smokes one pack of cigarettes over 2-3 days. He denies a family history of GI disorders.  History reviewed. No pertinent past medical history.  Past Surgical History:  Procedure Laterality Date  . None     No family history on file.  Social History:  reports that he has been smoking Cigarettes.  He has been smoking about 0.50 packs per day. He has never used smokeless tobacco. He reports that he drinks alcohol. He reports that he does not use drugs.  Allergies: No Known Allergies  Medications: I have reviewed the patient's current medications.  Results for orders placed or performed during the hospital encounter of 01/20/17 (from the past 48 hour(s))  CBC with Differential     Status: Abnormal   Collection Time: 01/20/17  5:30 AM  Result Value Ref Range   WBC 11.6 (H) 4.0 - 10.5 K/uL   RBC 2.41 (L) 4.22 - 5.81 MIL/uL   Hemoglobin 7.1 (L) 13.0 - 17.0 g/dL   HCT 20.3 (L) 39.0 - 52.0 %   MCV 84.2 78.0 - 100.0 fL   MCH 29.5 26.0 - 34.0 pg   MCHC 35.0 30.0 - 36.0 g/dL   RDW 13.0 11.5 - 15.5 %   Platelets 156 150 - 400 K/uL   Neutrophils Relative % 69 %   Neutro Abs 8.0 (H) 1.7 - 7.7 K/uL   Lymphocytes Relative 23 %   Lymphs Abs 2.6 0.7 - 4.0 K/uL   Monocytes Relative 8 %   Monocytes Absolute 0.9 0.1 - 1.0 K/uL   Eosinophils Relative 0 %   Eosinophils Absolute 0.0 0.0 - 0.7 K/uL   Basophils Relative 0 %   Basophils Absolute 0.0 0.0 - 0.1 K/uL  Comprehensive metabolic panel     Status: Abnormal   Collection Time: 01/20/17  5:30 AM  Result Value Ref Range   Sodium 132 (L) 135 - 145 mmol/L   Potassium 4.9 3.5 - 5.1 mmol/L   Chloride 101 101 - 111 mmol/L   CO2 21 (L) 22 - 32 mmol/L   Glucose, Bld 244 (H) 65 - 99 mg/dL   BUN 32 (H) 6 - 20 mg/dL   Creatinine, Ser 0.83 0.61 - 1.24 mg/dL   Calcium 8.3 (L) 8.9 - 10.3 mg/dL   Total Protein 5.1 (L) 6.5 - 8.1 g/dL   Albumin 2.7 (  L) 3.5 - 5.0 g/dL   AST 24 15 - 41 U/L   ALT 23 17 - 63 U/L   Alkaline Phosphatase 48 38 - 126 U/L   Total Bilirubin 1.1 0.3 - 1.2 mg/dL   GFR calc non Af Amer >60 >60 mL/min   GFR calc Af Amer >60 >60 mL/min    Comment: (NOTE) The eGFR has been calculated using the CKD EPI equation. This calculation has not been validated in all clinical situations. eGFR's persistently <60 mL/min signify possible Chronic Kidney Disease.    Anion gap 10 5 - 15  Protime-INR     Status: Abnormal   Collection Time: 01/20/17  5:30 AM  Result Value Ref Range   Prothrombin Time 16.1 (H) 11.4 - 15.2 seconds   INR 1.28   APTT     Status: None   Collection Time: 01/20/17  5:30 AM  Result Value Ref Range   aPTT 28 24 - 36 seconds  Type and screen     Status: None (Preliminary result)   Collection Time: 01/20/17  5:30 AM  Result Value Ref Range   ABO/RH(D) B POS    Antibody Screen NEG    Sample Expiration 01/23/2017    Unit Number [id number]     Blood Component Type RED CELLS,LR    Unit division 00    Status of Unit ALLOCATED    Transfusion Status OK TO TRANSFUSE    Crossmatch Result Compatible    Unit Number [id number]    Blood Component Type RED CELLS,LR    Unit division 00    Status of Unit ISSUED    Transfusion Status OK TO TRANSFUSE    Crossmatch Result Compatible   Troponin I     Status: None   Collection Time: 01/20/17  5:30 AM  Result Value Ref Range   Troponin I <0.03 <0.03 ng/mL  ABO/Rh     Status: None (Preliminary result)   Collection Time: 01/20/17  5:30 AM  Result Value Ref Range   ABO/RH(D) B POS   Prepare RBC     Status: None   Collection Time: 01/20/17  6:32 AM  Result Value Ref Range   Order Confirmation ORDER PROCESSED BY BLOOD BANK   POC occult blood, ED     Status: Abnormal   Collection Time: 01/20/17  6:48 AM  Result Value Ref Range   Fecal Occult Bld POSITIVE (A) NEGATIVE  CBG monitoring, ED     Status: Abnormal   Collection Time: 01/20/17  8:06 AM  Result Value Ref Range   Glucose-Capillary 146 (H) 65 - 99 mg/dL    Dg Chest Portable 1 View  Result Date: 01/20/2017 CLINICAL DATA:  Cough for several months and upper GI bleed. EXAM: PORTABLE CHEST 1 VIEW COMPARISON:  02/17/2007 chest radiograph FINDINGS: The cardiomediastinal silhouette is unremarkable. There is no evidence of focal airspace disease, pulmonary edema, suspicious pulmonary nodule/mass, pleural effusion, or pneumothorax. No acute bony abnormalities are identified. IMPRESSION: No active disease. Electronically Signed   By: Margarette Canada M.D.   On: 01/20/2017 07:40   Review of Systems  Constitutional: Positive for diaphoresis and malaise/fatigue. Negative for chills and fever.  Gastrointestinal: Positive for blood in stool, melena, nausea and vomiting.  Genitourinary: Negative.   Skin: Negative.   Neurological: Positive for dizziness and weakness.  Endo/Heme/Allergies: Negative.   Psychiatric/Behavioral: Positive for substance  abuse.   Blood pressure (!) 115/53, pulse (!) 113, temperature 98 F (36.7 C), temperature source Oral, resp. rate 20,  height 5' 7" (1.702 m), weight 65.8 kg (145 lb), SpO2 100 %. Physical Exam  Constitutional: He is oriented to person, place, and time. He appears well-developed and well-nourished.  HENT:  Head: Normocephalic and atraumatic.  He has several caried teeth  Eyes: Conjunctivae and EOM are normal. Pupils are equal, round, and reactive to light.  Neck: Normal range of motion. Neck supple.  Cardiovascular: Normal rate and regular rhythm.   Respiratory: Effort normal and breath sounds normal.  GI: Soft. Bowel sounds are normal.  Musculoskeletal: Normal range of motion.  Neurological: He is alert and oriented to person, place, and time.  Skin: Skin is warm and dry.  Psychiatric: He has a normal mood and affect. His behavior is normal. Judgment and thought content normal.   Assessment/Plan: 1) Melena with and anemia in a 41-year-old male with a history of nonsteroidal and alcohol abuse; rule out alcoholic gastritis versus peptic ulcer disease. EGD planned for tomorrow.  2) Hyperglycemia-check fasting hemoglobin A1c.  3) Poor dentition needs a dental consult    MANN,JYOTHI 01/20/2017, 9:07 AM     

## 2017-01-21 NOTE — Interval H&P Note (Signed)
History and Physical Interval Note:  01/21/2017 11:31 AM  Aaron Walker  has presented today for surgery, with the diagnosis of Melena and anemia  The various methods of treatment have been discussed with the patient and family. After consideration of risks, benefits and other options for treatment, the patient has consented to  Procedure(s): ESOPHAGOGASTRODUODENOSCOPY (EGD) (N/A) as a surgical intervention .  The patient's history has been reviewed, patient examined, no change in status, stable for surgery.  I have reviewed the patient's chart and labs.  Questions were answered to the patient's satisfaction.     [name]

## 2017-01-21 NOTE — Op Note (Signed)
[facility] Patient Name: Aaron Walker Procedure Date : 01/21/2017 MRN: [id number] Attending MD: Estill Cotta. Loletha Carrow , MD Date of Birth: [birth year] CSN: [id number] Age: 41 Admit Type: Inpatient Procedure:                Upper GI endoscopy Indications:              Acute post hemorrhagic anemia, Melena Providers:                Mallie Mussel L. Loletha Carrow, MD, Jobe Igo, RN, Corliss Parish, Technician Referring MD:              Medicines:                General Anesthesia Complications:            No immediate complications. Estimated Blood Loss:     Estimated blood loss: 30 mL. Procedure:                Pre-Anesthesia Assessment:                           - Prior to the procedure, a History and Physical                            was performed, and patient medications and                            allergies were reviewed. The patient's tolerance of                            previous anesthesia was also reviewed. The risks                            and benefits of the procedure and the sedation                            options and risks were discussed with the patient.                            All questions were answered, and informed consent                            was obtained. Prior Anticoagulants: The patient has                            taken no previous anticoagulant or antiplatelet                            agents. ASA Grade Assessment: II - A patient with                            mild systemic disease. After reviewing the risks  and benefits, the patient was deemed in                            satisfactory condition to undergo the procedure.                           After obtaining informed consent, the endoscope was                            passed under direct vision. Throughout the                            procedure, the patient's blood pressure, pulse, and                            oxygen  saturations were monitored continuously. The                            [id number]-[id number] ([id number]) scope was introduced through the                            mouth, and advanced to the second part of duodenum.                            The upper GI endoscopy was technically difficult                            and complex due to edema in the duodenal sweep and                            very challenging location of lesion in that area.                            The patient tolerated the procedure. Scope In: Scope Out: Findings:      The esophagus was normal.      Scattered mild inflammation characterized by erosions was found in the       gastric antrum.      Red blood was found in the first portion of the duodenum. It stopped       spontaneously after several minutes.      Several superficial ulcers were seen in the duodenal bulb and sweep. One       oozing superficial duodenal ulcer with a visible vessel was found in the       first portion of the duodenum. The lesion was 5 mm in largest dimension.       For hemostasis, two hemostatic clips were successfully placed (MR       conditional). There was no bleeding at the end of the procedure. Bipolar       cautery was also applied to the vessel.      The cardia and gastric fundus were normal on retroflexion.      The patient was extremely difficult to sedate with propofol.      When active bleeding was found and the source difficult to localize, the       scope was  removed and the patient electively intubated for improved       sedation and airway control.      The lesion was very difficult to see for longer than a few seconds,       making endoscopic intervention extremely difficult. The procedure lasted       nearly 2 hours as a result.      There was no bleeding at the end of the procedure. Impression:               - Normal esophagus.                           - Gastritis.                           - Blood in the first portion of the  duodenum.                           - One oozing duodenal ulcer with a visible vessel.                            Clips (MR conditional) were placed.                           - No specimens collected. Moderate Sedation:      GETA (converted to GETA after starting with MAC sedation due to       patient's coughing and lack of cooperation even with high dose propofol.       Active bleeding increased risk of aspiration.) Recommendation:           - Full liquid diet.                           - Hemoglobin check this evening and CBC in AM                            (ordered)                           H. pylori IgG antibody in AM                           Continue protonix 40 mg twice daily                           Avoid all NSAIDs and aspirin.                           - Continue present medications. Procedure Code(s):        --- Professional ---                           [phone], Esophagogastroduodenoscopy, flexible,                            transoral; with control of bleeding, any method Diagnosis Code(s):        --- Professional ---  K29.70, Gastritis, unspecified, without bleeding                           K92.2, Gastrointestinal hemorrhage, unspecified                           K26.4, Chronic or unspecified duodenal ulcer with                            hemorrhage                           D62, Acute posthemorrhagic anemia                           K92.1, Melena (includes Hematochezia) CPT copyright 2016 American Medical Association. All rights reserved. The codes documented in this report are preliminary and upon coder review may  be revised to meet current compliance requirements. Jasiah Elsen L. Loletha Carrow, MD 01/21/2017 1:46:59 PM This report has been signed electronically. Number of Addenda: 0

## 2017-01-22 DIAGNOSIS — D649 Anemia, unspecified: Secondary | ICD-10-CM

## 2017-01-22 LAB — TYPE AND SCREEN
ABO/RH(D): B POS
Antibody Screen: NEGATIVE
Unit division: 0
Unit division: 0
Unit division: 0

## 2017-01-22 LAB — HEMOGLOBIN A1C
Hgb A1c MFr Bld: 5.3 % (ref 4.8–5.6)
Mean Plasma Glucose: 105 mg/dL

## 2017-01-22 LAB — BPAM RBC
Blood Product Expiration Date: 201805102359
Blood Product Expiration Date: 201805102359
Blood Product Expiration Date: 201805102359
ISSUE DATE / TIME: 201804150729
ISSUE DATE / TIME: 201804151148
ISSUE DATE / TIME: 201804161016
Unit Type and Rh: 7300
Unit Type and Rh: 7300
Unit Type and Rh: 7300

## 2017-01-22 LAB — CBC
HCT: 22.2 % — ABNORMAL LOW (ref 39.0–52.0)
Hemoglobin: 7.8 g/dL — ABNORMAL LOW (ref 13.0–17.0)
MCH: 29.8 pg (ref 26.0–34.0)
MCHC: 35.1 g/dL (ref 30.0–36.0)
MCV: 84.7 fL (ref 78.0–100.0)
Platelets: 126 10*3/uL — ABNORMAL LOW (ref 150–400)
RBC: 2.62 MIL/uL — ABNORMAL LOW (ref 4.22–5.81)
RDW: 14.4 % (ref 11.5–15.5)
WBC: 8.2 10*3/uL (ref 4.0–10.5)

## 2017-01-22 LAB — GLUCOSE, CAPILLARY: Glucose-Capillary: 118 mg/dL — ABNORMAL HIGH (ref 65–99)

## 2017-01-22 LAB — HEMOGLOBIN: Hemoglobin: 8.8 g/dL — ABNORMAL LOW (ref 13.0–17.0)

## 2017-01-22 NOTE — Anesthesia Postprocedure Evaluation (Addendum)
Anesthesia Post Note  Patient: Darik Massing  Procedure(s) Performed: Procedure(s) (LRB): ESOPHAGOGASTRODUODENOSCOPY (EGD) (N/A)  Patient location during evaluation: Endoscopy Anesthesia Type: General Level of consciousness: awake and alert Pain management: pain level controlled Vital Signs Assessment: post-procedure vital signs reviewed and stable Respiratory status: spontaneous breathing, nonlabored ventilation, respiratory function stable and patient connected to nasal cannula oxygen Cardiovascular status: blood pressure returned to baseline and stable Postop Assessment: no signs of nausea or vomiting Anesthetic complications: no       Last Vitals:  Vitals:   01/21/17 2100 01/22/17 0600  BP: 115/66 110/64  Pulse: 97 97  Resp: 20 18  Temp: 36.8 C 36.7 C    Last Pain:  Vitals:   01/22/17 0600  TempSrc: Oral  PainSc:                  Adjoa Althouse,JAMES TERRILL

## 2017-01-22 NOTE — Progress Notes (Signed)
   Subjective: Patient feels well today. He is ambulating around his room. Denies chest pain, SOB, dizziness, and lightheadedness. He is tolerating liquid diet well without issue. He has not had a BM since yesterday. He denies further episodes of bleeding.   Objective:  Vital signs in last 24 hours: Vitals:   01/21/17 1409 01/21/17 1422 01/21/17 2100 01/22/17 0600  BP: 119/65 113/66 115/66 110/64  Pulse: 100 (!) 103 97 97  Resp: (!) 35 (!) Temp:  98.4 F (36.9 C) 98.3 F (36.8 C) 98.1 F (36.7 C)  TempSrc:  Oral Oral Oral  SpO2: 95% 95% 94% 100%  Weight:      Height:       Physical Exam Constitutional: NAD, appears comfortable Cardiovascular:RRR, no murmurs, rubs, or gallops.  Pulmonary/Chest:CTAB, no wheezes, rales, or rhonchi.  Abdominal:Soft, non tender, non distended. +BS.  Extremities: Warm and well perfused. Distal pulses intact. No edema.  Neurological:A&Ox3, CN II - XII grossly intact.   Assessment/Plan:  Patient is a 41 year old male with history of daily alcohol and NSAID use who presents with symptomatic anemia after 3 episodes of melena and 1 episode of coffee ground emesis concerning for upper GI bleed.   Acute GI Bleed:In the setting of daily alcohol and NSAID use. Patient presented with symptomatic anemia (lightheadedness, generalized weakness) after new onset melena and coffee ground emesis. Hemoglobin was 7.1 on admission. It improved to 9.2 after 2 units but down trended again to 7.7 the following day. He received a 3rd unit on 4/16 and hemoglobin improved to 8.2, but down to to 7.8 this morning. EGD 4/17 notable for gastritis, blood in the first portion of the duodenum, one oozing duodenal ulcer with a visible vessel, s/p placement of 2 clips. Patient denies any further episodes of melena and is tolerating liquid diet well.  -- GI consult, appreciate recommendations -- H Pylori antibody pending  -- Liquid diet, advance to soft this afternoon if  tolerating well  -- q8 hour hemoglobin checks  -- Transfuse prn to maintain hgb > 7.0  -- Continue IV Protonix 40 mg BID -- Avoid NSAIDs and aspirin; smoking and alcohol cessation encouraged  -- NS @ 100 cc/hr   Hx Alcohol Abuse: Patient drinks at least 1-2 40 oz beers a day and gets the "shakes" when he does not drink. He denies ever experiencing a withdrawal seizure.  -- CIWA protocol  -- Folic acid, thiamine, multivitamin daily   FEN: No fluids, replete lytes prn, liquid diet VTE ppx: SCDs  Code Status: FULL  Dispo: Anticipated discharge in approximately 1-2 day(s).   Aaron Poll, MD 01/22/2017, 11:45 AM Pager: [phone]

## 2017-01-22 NOTE — Progress Notes (Signed)
Progress Note   Subjective  Chief Complaint: GI Bleed, Melena  Pt found laying in bed this morning, tolerated his full liquid diet this morning, no further melena. Abdominal pain better. No new complaints.   Objective   Vital signs in last 24 hours: Temp:  [97.7 F (36.5 C)-99.1 F (37.3 C)] 98.1 F (36.7 C) (04/17 0600) Pulse Rate:  [97-130] 97 (04/17 0600) Resp:  [18-38] 18 (04/17 0600) BP: (101-152)/(51-77) 110/64 (04/17 0600) SpO2:  [90 %-100 %] 100 % (04/17 0600) Last BM Date: 01/19/17 General: African American male in NAD Heart:  Regular rate and rhythm; no murmurs Lungs: Respirations even and unlabored, lungs CTA bilaterally Abdomen:  Soft, nontender and nondistended. Normal bowel sounds. Extremities:  Without edema. Neurologic:  Alert and oriented,  grossly normal neurologically. Psych:  Cooperative. Normal mood and affect.  Intake/Output from previous day: 04/16 0701 - 04/17 0700 In: 4482 [P.O.:1200; I.V.:3000; Blood:282] Out: 360 [Urine:350; Blood:10]  Lab Results:  Recent Labs  01/20/17 1755 01/21/17 0339 01/21/17 1834 01/22/17 0447  WBC 12.1* 9.0  --  8.2  HGB 9.4* 7.7* 8.6* 7.8*  HCT 26.7* 21.5* 24.6* 22.2*  PLT 145* 122*  --  126*   BMET  Recent Labs  01/20/17 0530 01/21/17 0339  NA 132* 135  K 4.9 4.1  CL 101 108  CO2 21* 21*  GLUCOSE 244* 93  BUN 32* 15  CREATININE 0.83 0.78  CALCIUM 8.3* 8.2*   LFT  Recent Labs  01/21/17 0339  PROT 4.7*  ALBUMIN 2.5*  AST 25  ALT 24  ALKPHOS 37*  BILITOT 0.7   PT/INR  Recent Labs  01/20/17 0530  LABPROT 16.1*  INR 1.28    Studies/Results: EGD 01/21/17- Findings:      The esophagus was normal.      Scattered mild inflammation characterized by erosions was found in the       gastric antrum.      Red blood was found in the first portion of the duodenum. It stopped       spontaneously after several minutes.      Several superficial ulcers were seen in the duodenal bulb and  sweep. One       oozing superficial duodenal ulcer with a visible vessel was found in the       first portion of the duodenum. The lesion was 5 mm in largest dimension.       For hemostasis, two hemostatic clips were successfully placed (MR       conditional). There was no bleeding at the end of the procedure. Bipolar       cautery was also applied to the vessel.      The cardia and gastric fundus were normal on retroflexion.      The patient was extremely difficult to sedate with propofol.      When active bleeding was found and the source difficult to localize, the       scope was removed and the patient electively intubated for improved       sedation and airway control.      The lesion was very difficult to see for longer than a few seconds,       making endoscopic intervention extremely difficult. The procedure lasted       nearly 2 hours as a result.      There was no bleeding at the end of the procedure. Impression:               -  Normal esophagus.                           - Gastritis.                           - Blood in the first portion of the duodenum.                           - One oozing duodenal ulcer with a visible vessel.                            Clips (MR conditional) were placed.                           - No specimens collected. Recommendation: - Full liquid diet.                           - Hemoglobin check this evening and CBC in AM                            (ordered)                           H. pylori IgG antibody in AM                           Continue protonix 40 mg twice daily                           Avoid all NSAIDs and aspirin.                           - Continue present medications.   Assessment / Plan:   Assessment: 1. Bleeding duodenal ulcer: found at time of EGD yesterday, thought to be source of bleeding, patient remaining stable at this time 2. ABLA: hgb from 8.6 yesterday to 7.8 this morning  Plan: 1. Continue full liquid diet for  now-consider soft food in pm if doing well 2. Would recommend repeat hgb check q8 hours to ensure this is not continuing to trend down, if does well overnight and hgb stays stable-could consider d/c tomorrow 3. Continue Pantoprazole  BID 4. Advised patient to avoid NSAIDs in the future as this was thought to be the cause of recent ulcer 5. H.Pylori IgG Ab pending 6. Please await any further recs from Dr. Myrtie Neither  Thank you for your kind consultation, we will continue to follow   LOS: 2 days   Unk Lightning  01/22/2017, 10:03 AM  Pager # [phone]   I have discussed the case with the PA, and that is the plan I formulated. I personally interviewed and examined the patient.  He is doing well and does not appear to be bleeding at this point.  I will advance diet.  We will follow up on H pylori testing and treat if needed.  Avoid NSAIDs/ASA  Twice daily PPI (OTC omeprazole 20 mg would be fine for cost reasons)  We will schedule any necessary GI follow up.  Signing off - call as needed.  [name] Pager [phone]  Mon-Fri 8a-5p [phone] after 5p, weekends, holidays

## 2017-01-22 NOTE — Progress Notes (Signed)
Transitions of Care Pharmacy Note  Plan:  Educated on avoidance of NSAIDs, avoiding doses of APAP >2g (EtOH hx and low PLTC+high baseline INR not on warfarin), MOA and importance of taking PPI once outpatient, importance of low EtOH consumption. Addressed concerns regarding length of therapy for PPI Inpatient follow-up: tooth/ear pain, H. Pylori test results - Called [facility], there is a 1 day turn around for this lab test --------------------------------------------- Aaron Walker is an 41 y.o. Aaron Walker with a chief complaint GIB. In anticipation of discharge, pharmacy has reviewed this patient's prior to admission medication history, as well as current inpatient medications listed per the [facility].  Current medication indications, dosing, frequency, and notable side effects reviewed with patient. patient verbalized understanding of current inpatient medication regimen and is aware that the After Visit Summary when presented, will represent the most accurate medication list at discharge.   Aaron Walker expressed concerns regarding how long he would have to be on medication. Patient also expresses concern over chronic ear and tooth pain, asked patient to address in inpatient team in the AM. Pt with elevated baseline INR and low PLTC, not on AC PTA.    Assessment: Understanding of regimen: good Understanding of indications: good Potential of compliance: good Barriers to Obtaining Medications: No  Patient instructed to contact inpatient pharmacy team with further questions or concerns if needed.    Time spent preparing for discharge counseling: 10 mins Time spent counseling patient: 15 mins   Thank you for allowing pharmacy to be a part of this patient's care.  Allena Katz, Pharm.D. PGY1 Pharmacy Resident 4/17/20186:40 PM Pager [phone]

## 2017-01-23 ENCOUNTER — Telehealth: Payer: Self-pay

## 2017-01-23 DIAGNOSIS — K2971 Gastritis, unspecified, with bleeding: Secondary | ICD-10-CM

## 2017-01-23 DIAGNOSIS — Z9889 Other specified postprocedural states: Secondary | ICD-10-CM

## 2017-01-23 DIAGNOSIS — D62 Acute posthemorrhagic anemia: Secondary | ICD-10-CM

## 2017-01-23 DIAGNOSIS — R000 Tachycardia, unspecified: Secondary | ICD-10-CM

## 2017-01-23 DIAGNOSIS — F1020 Alcohol dependence, uncomplicated: Secondary | ICD-10-CM

## 2017-01-23 DIAGNOSIS — R195 Other fecal abnormalities: Secondary | ICD-10-CM

## 2017-01-23 LAB — GLUCOSE, CAPILLARY: Glucose-Capillary: 137 mg/dL — ABNORMAL HIGH (ref 65–99)

## 2017-01-23 LAB — HEMOGLOBIN AND HEMATOCRIT, BLOOD
HCT: 21.9 % — ABNORMAL LOW (ref 39.0–52.0)
Hemoglobin: 7.6 g/dL — ABNORMAL LOW (ref 13.0–17.0)

## 2017-01-23 LAB — HEMOGLOBIN
Hemoglobin: 7.4 g/dL — ABNORMAL LOW (ref 13.0–17.0)
Hemoglobin: 8.1 g/dL — ABNORMAL LOW (ref 13.0–17.0)
Hemoglobin: 9 g/dL — ABNORMAL LOW (ref 13.0–17.0)

## 2017-01-23 LAB — H. PYLORI ANTIBODY, IGG: H Pylori IgG: 2.12 Index Value — ABNORMAL HIGH (ref 0.00–0.79)

## 2017-01-23 MED ORDER — ACETAMINOPHEN 325 MG PO TABS
650.0000 mg | ORAL_TABLET | Freq: Four times a day (QID) | ORAL | Status: DC | PRN
  Administered 2017-01-24 (×2): 650 mg via ORAL
  Filled 2017-01-23 (×2): qty 2

## 2017-01-23 NOTE — Clinical Social Work Note (Signed)
CSW met with pt at bedside to address consult about "No insurance." Pt to apply for Medicaid. CSW provided resources for free and low cost dentist. CSW left VM with financial counselor Maia Plan to confirm pt has been assessed. RNCM to address medication assistance. CSW signing off, as no further SW needs identified. Please resconsult if new SW needs arise.   Oretha Ellis, [place], [place] Work [phone]

## 2017-01-23 NOTE — Care Management Note (Signed)
Case Management Note  Patient Details  Name: Aaron Walker MRN: [id number] Date of Birth: [birth year]  Subjective/Objective:   Pt admitted on 01/20/17 with GI bleed in the setting of daily ETOH and NSAID use.  PTA, pt independent of ADLS.                   Action/Plan: Will follow for discharge planning as pt progresses.  Will refer to CSW for ETOH counseling.    Expected Discharge Date:  01/21/17               Expected Discharge Plan:  Home/Self Care  In-House Referral:     Discharge planning Services  CM Consult  Post Acute Care Choice:    Choice offered to:     DME Arranged:    DME Agency:     HH Arranged:    HH Agency:     Status of Service:  In process, will continue to follow  If discussed at Long Length of Stay Meetings, dates discussed:    Additional Comments:  Quintella Baton, RN, BSN  Trauma/Neuro ICU Case Manager [phone]

## 2017-01-23 NOTE — Telephone Encounter (Signed)
Hospital TOC per Dr Antony Contras, discharge 01/23/17, appt 02/05/2017.

## 2017-01-23 NOTE — Progress Notes (Signed)
   Subjective: Patient feels well today, he is asymptomatic. He reports two BM yesterday with dark black stool. No bright red blood. He has not yet had a BM today. He is tolerating PO without issue.   Objective:  Vital signs in last 24 hours: Vitals:   01/22/17 0600 01/22/17 1441 01/22/17 2106 01/23/17 0559  BP: 110/64 (!) 105/58 135/73 120/68  Pulse: 97 (!) 110 (!) 101 100  Resp: Temp: 98.1 F (36.7 C) 98 F (36.7 C) 98.1 F (36.7 C) 99.1 F (37.3 C)  TempSrc: Oral Oral Oral Oral  SpO2: 100% 100% 100% 100%  Weight:      Height:       Physical Exam Constitutional: NAD, appears comfortable Cardiovascular:RRR, no murmurs, rubs, or gallops.  Pulmonary/Chest:CTAB, no wheezes, rales, or rhonchi.  Abdominal:Soft, non tender, non distended. +BS.  Extremities: Warm and well perfused. Distal pulses intact. No edema.  Neurological:A&Ox3, CN II - XII grossly intact.    Assessment/Plan:  Patient is a 41 year old male with history of daily alcohol and NSAID use who presents with symptomatic anemia after 3 episodes of melena and 1 episode of coffee ground emesis concerning for upper GI bleed.   Acute GI Bleed:In the setting of daily alcohol and NSAID use. Patient presented with symptomatic anemia (lightheadedness, generalized weakness) after new onset melena and coffee ground emesis. Hemoglobin was 7.1 on admission. S/p 3 units of pRBC thus far. EGD 4/17 notable for gastritis, blood in the first portion of the duodenum, one oozing duodenal ulcer with a visible vessel, s/p placement of 2 clips. Hemoglobin persistently low this morning, 7.6. He continues to have BM with black stool.  -- GI consult, appreciate recommendations -- H Pylori antibody pending  -- q8 hour hemoglobin checks  -- Transfuse prn to maintain hgb > 7.0  -- Continue IV Protonix 40 mg BID -- Avoid NSAIDs and aspirin; smoking and alcohol cessation encouraged   Hx Alcohol Abuse: Patient drinks at least  1-2 40 oz beers a day and gets the "shakes" when he does not drink. He denies ever experiencing a withdrawal seizure.  -- CIWA protocol  -- Folic acid, thiamine, multivitamin daily   FEN: No fluids, replete lytes prn, regular diet VTE ppx: SCDs  Code Status: FULL  Dispo: Anticipated discharge in approximately 1-2 day(s).   Reymundo Poll, MD 01/23/2017, 9:57 AM Pager: [phone]

## 2017-01-24 DIAGNOSIS — K0889 Other specified disorders of teeth and supporting structures: Secondary | ICD-10-CM

## 2017-01-24 DIAGNOSIS — K260 Acute duodenal ulcer with hemorrhage: Secondary | ICD-10-CM

## 2017-01-24 LAB — HEMOGLOBIN
Hemoglobin: 8.5 g/dL — ABNORMAL LOW (ref 13.0–17.0)
Hemoglobin: 8 g/dL — ABNORMAL LOW (ref 13.0–17.0)

## 2017-01-24 LAB — GLUCOSE, CAPILLARY: Glucose-Capillary: 97 mg/dL (ref 65–99)

## 2017-01-24 MED ORDER — BENZOCAINE 10 % MT GEL
Freq: Two times a day (BID) | OROMUCOSAL | Status: DC | PRN
  Administered 2017-01-24: 1 via OROMUCOSAL
  Filled 2017-01-24: qty 9.4

## 2017-01-24 MED ORDER — PANTOPRAZOLE SODIUM 40 MG PO TBEC: 40.0000 mg | DELAYED_RELEASE_TABLET | Freq: Two times a day (BID) | ORAL | 2 refills | Status: DC

## 2017-01-24 MED ORDER — PANTOPRAZOLE SODIUM 40 MG PO TBEC
40.0000 mg | DELAYED_RELEASE_TABLET | Freq: Two times a day (BID) | ORAL | Status: DC
Start: 2017-01-24 — End: 2017-01-24
  Administered 2017-01-24: 40 mg via ORAL
  Filled 2017-01-24: qty 1

## 2017-01-24 MED FILL — PANTOPRAZOLE SOD DR 40 MG T: 40 | 30 days supply | Qty: 60 | Fill #0

## 2017-01-24 NOTE — Discharge Planning (Signed)
Verbalizes understanding of all discharge instructions, including home medications and follow up appointments. 

## 2017-01-24 NOTE — Discharge Summary (Signed)
Name: Aaron Walker MRN: [id number] DOB: [birth year] 41 y.o. PCP: No Pcp Per Patient  Date of Admission: 01/20/2017  5:21 AM Date of Discharge: 01/24/2017 Attending Physician: Aaron Spain, MD  Discharge Diagnosis: 1. Acute Upper GI Bleed   Discharge Medications: Allergies as of 01/24/2017   No Known Allergies     Medication List    STOP taking these medications   ibuprofen 200 MG tablet Commonly known as:  ADVIL,MOTRIN     TAKE these medications   pantoprazole 40 MG tablet Commonly known as:  PROTONIX Take 1 tablet (40 mg total) by mouth 2 (two) times daily.       Disposition and follow-up:   Aaron Walker was discharged from [facility] in Stable condition.  At the hospital follow up visit please address:  1.  Acute Upper GI Bleed: S/p 3 units of pRBCs and EGD with clipping of one bleeding duodenal ulcer with visible vessel. Patient was educated on the importance of avoiding NSAIDs, alcohol, and tobacco cessation. He was discharged on Protonix 40 mg BID. H. pylori antibody checked per GI recommendations and resulted positive. Follow up H. Pylori stool antigen checked and pending on discharge. Please follow up and treat if positive.   2.  Labs / imaging needed at time of follow-up: CBC  3.  Pending labs/ test needing follow-up: H. Pylori Stool Antigen   Follow-up Appointments: Follow-up Information    [facility]. Go on 02/05/2017.   Why:  at 3:15 pm for hospital follow up. Please arrive 15 minutes early. Our clinic is located on the ground floor of the hospital in the east wing.  Contact information: [address] [place] [address] [phone]          Hospital Course by problem list:  1. Acute Upper GI Bleed: Patient is a 41 yo M with no significant past medical history who presented with symptomatic anemia after multiple episodes of melena and one episode of coffee ground emesis in the setting  of daily alcohol and NSAID use. Patient reported taking bayer's aspirin and ibuprofen regularly for chronic dental pain. He endorsed lightheadedness, dizziness, and two episodes of near syncope. Hemoglobin on arrival was 7.1 (last CBC in our records 8 years ago with hemoglobin 18). He was tachycardic with HR 110s and hemodynamically stable with BP 108/66, RR 16, oxygen 100% on RA and temperature 97.7. He was started on IV Protonix and received 2 units of pRBC on admission. Hemoglobin came up to 9.4 post transfusion but then continued to downtrend to 7.7 the following morning. GI was consulted and he underwent EGD the following day which revealed gastritis, blood in the first portion of the duodenum, and one oozing duodenal ulcer with visible vessel that required clipping. He received a 3rd unit of pRBCs and hemoglobin subsequently stabilized. H pylori serum antibody was checked per GI and resulted positive. Follow up H. Pylori stool antigen was checked and pending on discharge. Patient was educated on the importance of avoiding NSAIDs, alcohol, and tobacco cessation. He was discharged on protonix 40 mg BID. Please follow up H pylori stool antigen and treat if appropriate.   2. Poor dentition: Patient instructed to avoid NSAIDs for pain and to use tylenol instead. Social work provided patient with a Associate Professor resources.   Discharge Vitals:   BP (!) 158/91 (BP Location: Right Arm)   Pulse (!) 110   Temp 98.2 F (36.8 C) (Oral)  Resp 19   Ht  (1.702 m)   Wt 145 lb (65.8 kg)   SpO2 100%   BMI 22.71 kg/m   Pertinent Labs, Studies, and Procedures:   01/21/2017 EGD:  Impression: - Normal esophagus. - Gastritis. - Blood in the first portion of the duodenum. - One oozing duodenal ulcer with a visible vessel. Clips (MR conditional) were placed. - No specimens collected.  Discharge Instructions: Discharge Instructions    Call MD for:  extreme fatigue    Complete by:  As directed     Call MD for:  persistant dizziness or light-headedness    Complete by:  As directed    Call MD for:  persistant nausea and vomiting    Complete by:  As directed    Call MD for:  severe uncontrolled pain    Complete by:  As directed    Call MD for:  temperature >100.4    Complete by:  As directed    Discharge instructions    Complete by:  As directed    Aaron Walker,   It was a pleasure taking care of you. I am glad you are feeling better. For your stomach ulcers, please take Protonix (aka pantoprazole) twice a day. This medicine should be $4 at the outpatient [facility]. It is also important that you stop smoking, drinking, and don't take any NSAIDs (advil, ibuprofen, naproxen, aleve, etc.) or Aspirin in order to protect your stomach and avoid developing another ulcer. You may take tylenol for your dental pain. I will call you with the results of your lab work. If you test positive for the bacteria H Pylori, you will need to complete 2 weeks of antibiotic therapy. You are scheduled to follow up in the Internal Medicine Clinic on May 1st at 3:15 pm. Please keep this appointment. Our clinic is located on the ground floor of the hospital in the east wing. If you have any questions or concerns, call our clinic at [phone] or after hours call [phone] and ask for the internal medicine resident on call. Thank you!   - Dr. Antony Walker   Increase activity slowly    Complete by:  As directed       Signed: Reymundo Poll, MD 01/24/2017, 10:37 AM   Pager: [phone]

## 2017-01-24 NOTE — Care Management Note (Signed)
Case Management Note  Patient Details  Name: Aaron Walker MRN: [id number] Date of Birth: 01-19-1976  Subjective/Objective:                    Action/Plan:  Provided patient with MATCH letter , [facility], and dental clinics information and explained .  Patient voiced understanding to all of above. Expected Discharge Date:  01/24/17               Expected Discharge Plan:  Home/Self Care  In-House Referral:  Clinical Social Work  Discharge planning Services  CM Consult, MATCH Program, Medication Assistance, Indigent Health Clinic  Post Acute Care Choice:    Choice offered to:  Patient  DME Arranged:    DME Agency:     HH Arranged:    HH Agency:     Status of Service:  Completed, signed off  If discussed at [employer], dates discussed:    Additional Comments:  Kingsley Plan, RN 01/24/2017, 11:53 AM

## 2017-01-24 NOTE — Progress Notes (Signed)
   Subjective: Hemoglobin has remained stable. He is asymptomatic, denies lightheadedness or dizziness. He is complaining of some chronic dental pain.  Reports multiple BM yesterday, unsure if they were dark in color. Denies bright red blood.   Objective:  Vital signs in last 24 hours: Vitals:   01/23/17 0559 01/23/17 1308 01/23/17 2218 01/24/17 0459  BP: 120/68 106/62 (!) 148/82 (!) 158/91  Pulse: 100 (!) 113 (!) 115 (!) 110  Resp: Temp: 99.1 F (37.3 C) 98.4 F (36.9 C) 98.2 F (36.8 C) 98.2 F (36.8 C)  TempSrc: Oral Oral Oral Oral  SpO2: 100% 100% 100% 100%  Weight:      Height:       Physical Exam Constitutional: NAD, appears comfortable Cardiovascular:RRR, no murmurs, rubs, or gallops.  Pulmonary/Chest:CTAB, no wheezes, rales, or rhonchi.  Abdominal:Soft, non tender, non distended. +BS.  Extremities: Warm and well perfused. Distal pulses intact. No edema.  Neurological:A&Ox3, CN II - XII grossly intact.    Assessment/Plan:  Patient is a 41 year old male with history of daily alcohol and NSAID use who presents with symptomatic anemia after 3 episodes of melena and 1 episode of coffee ground emesis concerning for upper GI bleed.   Acute GI Bleed:In the setting of daily alcohol and NSAID use. Patient presented with symptomatic anemia (lightheadedness, generalized weakness) after new onset melena and coffee ground emesis. Hemoglobin was 7.1 on admission. S/p 3 units of pRBC thus far. EGD 4/17 notable for gastritis, blood in the first portion of the duodenum, one oozing duodenal ulcer with a visible vessel, s/p placement of 2 clips. Hemoglobin has remained stable. He is asymptomatic today and ready for discharge.  -- GI consult, appreciate recommendations -- H Pylori antibody >> Positive  -- Checking stool antigen  -- q8 hour hemoglobin checks  -- Transfuse prn to maintain hgb >7.0  -- Continue IV Protonix 40 mg BID -- Avoid NSAIDs and aspirin; smoking and  alcohol cessation encouraged   Hx Alcohol Abuse: Patient drinks at least 1-2 40 oz beers a day and gets the "shakes" when he does not drink. He denies ever experiencing a withdrawal seizure.  -- CIWA protocol  -- Folic acid, thiamine, multivitamin daily   FEN: No fluids, replete lytes prn, regular diet VTE ppx: SCDs  Code Status: FULL  Dispo: Anticipated discharge today pending collection of H pylori stool antigen.   Reymundo Poll, MD 01/24/2017, 7:01 AM Pager: [phone]

## 2017-01-26 LAB — H. PYLORI ANTIGEN, STOOL: H. Pylori Stool Ag, Eia: POSITIVE — AB

## 2017-01-28 ENCOUNTER — Encounter: Payer: Self-pay | Admitting: Internal Medicine

## 2017-01-28 ENCOUNTER — Telehealth: Payer: Self-pay | Admitting: Pharmacist

## 2017-01-28 DIAGNOSIS — A048 Other specified bacterial intestinal infections: Secondary | ICD-10-CM | POA: Insufficient documentation

## 2017-01-30 NOTE — Progress Notes (Signed)
Tried contacting patient to coordinate H pylori treatment, unable to reach

## 2017-02-04 ENCOUNTER — Telehealth: Payer: Self-pay | Admitting: General Practice

## 2017-02-04 NOTE — Telephone Encounter (Signed)
APT. REMINDER CALL, NO ANSWER, NO VOICEMAIL °

## 2017-02-05 ENCOUNTER — Ambulatory Visit: Payer: Self-pay

## 2017-02-11 ENCOUNTER — Ambulatory Visit: Payer: Self-pay

## 2017-02-22 NOTE — Telephone Encounter (Signed)
Pt states does not want to be ask questions

## 2017-03-08 NOTE — Addendum Note (Signed)
Addendum  created 03/08/17 1352 by Abdulahi Schor, MD   Sign clinical note    

## 2018-03-13 ENCOUNTER — Encounter (HOSPITAL_COMMUNITY): Payer: Self-pay | Admitting: Emergency Medicine

## 2018-03-13 ENCOUNTER — Emergency Department (HOSPITAL_COMMUNITY)
Admission: EM | Admit: 2018-03-13 | Discharge: 2018-03-13 | Disposition: A | Discharge status: Home / Self Care | Payer: Self-pay | Attending: Emergency Medicine | Admitting: Emergency Medicine

## 2018-03-13 DIAGNOSIS — R05 Cough: Secondary | ICD-10-CM

## 2018-03-13 DIAGNOSIS — R059 Cough, unspecified: Secondary | ICD-10-CM

## 2018-03-13 DIAGNOSIS — Z720 Tobacco use: Secondary | ICD-10-CM

## 2018-03-13 DIAGNOSIS — F17210 Nicotine dependence, cigarettes, uncomplicated: Secondary | ICD-10-CM | POA: Insufficient documentation

## 2018-03-13 DIAGNOSIS — J069 Acute upper respiratory infection, unspecified: Secondary | ICD-10-CM | POA: Insufficient documentation

## 2018-03-13 MED ORDER — ALBUTEROL SULFATE HFA 108 (90 BASE) MCG/ACT IN AERS
2.0000 | INHALATION_SPRAY | Freq: Once | RESPIRATORY_TRACT | Status: AC
  Administered 2018-03-13: 2 via RESPIRATORY_TRACT
  Filled 2018-03-13: qty 6.7

## 2018-03-13 NOTE — ED Triage Notes (Signed)
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

## 2018-03-13 NOTE — Discharge Instructions (Addendum)
You have declined getting a chest x-ray, therefore your evaluation is limited today because we can't definitively say whether you have pneumonia or any other concerning pulmonary conditions; you were told the risks including but not limited to: worsening condition all the way up to death; by refusing your chest x-ray, you are stating that you understand those risks.   Continue to stay well-hydrated. Gargle warm salt water and spit it out and use chloraseptic spray as needed for sore throat. Continue to alternate between Tylenol and Ibuprofen for pain or fever. Use Mucinex for cough suppression/expectoration of mucus. Use netipot and flonase to help with nasal congestion. May consider over-the-counter Benadryl or other antihistamine to decrease secretions and for help with your symptoms. Use inhaler as directed, as needed for cough/chest congestion/wheezing/shortness of breath. STOP SMOKING! Follow up with your primary care doctor in 5-7 days for recheck of ongoing symptoms. Return to emergency department for emergent changing or worsening of symptoms.

## 2018-03-13 NOTE — ED Provider Notes (Signed)
[facility]-EMERGENCY DEPT Provider Note   CSN: [id number] Arrival date & time: 03/13/18  1722     History   Chief Complaint Chief Complaint  Patient presents with  . needs doctor note  . URI    HPI Aaron Walker is a 42 y.o. male with a PMHx of GERD/PUD/H.pylori, who presents to the ED with complaints of URI symptoms for 3 days, and requesting a work note.  He reports having congestion, rhinorrhea, cough with yellow sputum production, sneezing, and body aches.  He has tried cough drops with some relief of his symptoms, no known aggravating factors.  He admits to being a cigarette smoker.  Denies any known sick contacts.  He was at the pool last week so is not sure whether he could have picked something up there.  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  He denies any fevers, chills, sore throat, ear pain or drainage, wheezing, CP, SOB, abd pain, N/V/D/C, hematuria, dysuria, arthralgias, numbness, tingling, focal weakness, or any other complaints at this time.   The history is provided by the patient and medical records. No language interpreter was used.  URI   Associated symptoms include congestion, rhinorrhea, sneezing and cough. Pertinent negatives include no chest pain, no abdominal pain, no diarrhea, no nausea, no vomiting, no dysuria, no ear pain, no sore throat and no wheezing.    Past Medical History:  Diagnosis Date  . Medical history non-contributory     Patient Active Problem List   Diagnosis Date Noted  . Positive H. pylori test 01/28/2017  . Symptomatic anemia   . Duodenal ulcer with hemorrhage   . Acute upper GI bleed 01/20/2017    Past Surgical History:  Procedure Laterality Date  . ESOPHAGOGASTRODUODENOSCOPY N/A 01/21/2017   Procedure: ESOPHAGOGASTRODUODENOSCOPY (EGD);  Surgeon: Sherrilyn Rist, MD;  Location: [facility] ENDOSCOPY;  Service: Endoscopy;  Laterality: N/A;  . NO PAST SURGERIES    .  None          Home Medications    Prior to Admission medications   Medication Sig Start Date End Date Taking? Authorizing Provider  pantoprazole (PROTONIX) 40 MG tablet Take 1 tablet (40 mg total) by mouth 2 (two) times daily. 01/24/17   Reymundo Poll, MD    Family History No family history on file.  Social History Social History   Tobacco Use  . Smoking status: Current Every Day Smoker    Packs/day: 0.50    Types: Cigarettes  . Smokeless tobacco: Never Used  Substance Use Topics  . Alcohol use: Yes    Comment: 1-2 40 oz   . Drug use: No     Allergies   Patient has no known allergies.   Review of Systems Review of Systems  Constitutional: Negative for chills and fever.  HENT: Positive for congestion, rhinorrhea and sneezing. Negative for ear discharge, ear pain and sore throat.   Respiratory: Positive for cough. Negative for shortness of breath and wheezing.   Cardiovascular: Negative for chest pain.  Gastrointestinal: Negative for abdominal pain, constipation, diarrhea, nausea and vomiting.  Genitourinary: Negative for dysuria and hematuria.  Musculoskeletal: Positive for myalgias (body aches). Negative for arthralgias.  Skin: Negative for color change.  Allergic/Immunologic: Negative for immunocompromised state.  Neurological: Negative for weakness and numbness.  Psychiatric/Behavioral: Negative for confusion.   All other systems reviewed and are negative for acute change except as noted in the HPI.  Physical Exam Updated Vital Signs BP (!) 154/100 (BP Location: Left Arm)   Pulse 97   Temp 99.3 F (37.4 C) (Oral)   Resp 16   Ht 5\' 7"  (1.702 m)   Wt 67.4 kg (148 lb 9 oz)   SpO2 100%   BMI 23.27 kg/m   Physical Exam  Constitutional: He is oriented to person, place, and time. Vital signs are normal. He appears well-developed and well-nourished.  Non-toxic appearance. No distress.  Afebrile, nontoxic, NAD  HENT:  Head: Normocephalic and  atraumatic.  Nose: Mucosal edema present.  Mouth/Throat: Uvula is midline, oropharynx is clear and moist and mucous membranes are normal. No trismus in the jaw. No uvula swelling. Tonsils are 0 on the right. Tonsils are 0 on the left. No tonsillar exudate.  Nose mildly congested. Oropharynx clear and moist, without uvular swelling or deviation, no trismus or drooling, no tonsillar swelling or erythema, no exudates.    Eyes: Conjunctivae and EOM are normal. Right eye exhibits no discharge. Left eye exhibits no discharge.  Neck: Normal range of motion. Neck supple.  Cardiovascular: Normal rate, regular rhythm, normal heart sounds and intact distal pulses. Exam reveals no gallop and no friction rub.  No murmur heard. Pulmonary/Chest: Effort normal. No respiratory distress. He has decreased breath sounds. He has no wheezes. He has no rhonchi. He has no rales.  Very slightly diminished lung sounds in b/l lower fields, CTAB in all other lung fields, no w/r/r, no hypoxia or increased WOB, speaking in full sentences, SpO2 100% on RA   Abdominal: Soft. Normal appearance and bowel sounds are normal. He exhibits no distension. There is no tenderness. There is no rigidity, no rebound, no guarding, no CVA tenderness, no tenderness at McBurney's point and negative Murphy's sign.  Musculoskeletal: Normal range of motion.  Neurological: He is alert and oriented to person, place, and time. He has normal strength. No sensory deficit.  Skin: Skin is warm, dry and intact. No rash noted.  Psychiatric: He has a normal mood and affect.  Nursing note and vitals reviewed.    ED Treatments / Results  Labs (all labs ordered are listed, but only abnormal results are displayed) Labs Reviewed - No data to display  EKG None  Radiology No results found.  Procedures Procedures (including critical care time)  Medications Ordered in ED Medications  albuterol (PROVENTIL HFA;VENTOLIN HFA) 108 (90 Base) MCG/ACT inhaler  2 puff (2 puffs Inhalation Given 03/13/18 2000)     Initial Impression / Assessment and Plan / ED Course  I have reviewed the triage vital signs and the nursing notes.  Pertinent labs & imaging results that were available during my care of the patient were reviewed by me and considered in my medical decision making (see chart for details).     42 y.o. male here with URI symptoms and cough x3 days, needs work note. On exam, slightly diminished lung sounds in bilateral fields, no wheezing/rhonchi/rales; mild nasal congestion, throat clear, VSS. I recommended obtaining CXR to ensure no PNA, and giving albuterol inhaler, but pt declined wanting CXR; states he's fine and just wants a work note; r/b/a discussed, and he understands the risks and still declines; he is agreeable to getting albuterol tx though. Will recheck lung sounds after that. Doubt need for labs or other meds at this time.   8:01 PM Lung sounds improved after albuterol. Pt again declined wanting CXR. Advised OTC remedies for symptomatic relief of his URI/cough as well as use  of inhaler, and strict return precautions were advised. Smoking cessation encouraged. F/up with PCP in 1wk for recheck. I explained the diagnosis and have given explicit precautions to return to the ER including for any other new or worsening symptoms. The patient understands and accepts the medical plan as it's been dictated and I have answered their questions. Discharge instructions concerning home care and prescriptions have been given. The patient is STABLE and is discharged to home in good condition.    Final Clinical Impressions(s) / ED Diagnoses   Final diagnoses:  Upper respiratory tract infection, unspecified type  Cough  Tobacco user    ED Discharge Orders    [address]None       Wrenley Sayed, PadenMercedes, New JerseyPA-C 03/13/18 Ezra [id number]    Haviland, Julie, MD 03/13/18 2013

## 2019-02-03 ENCOUNTER — Emergency Department (HOSPITAL_COMMUNITY)
Admission: EM | Admit: 2019-02-03 | Discharge: 2019-02-03 | Disposition: A | Discharge status: Home / Self Care | Payer: HRSA Program | Attending: Emergency Medicine | Admitting: Emergency Medicine

## 2019-02-03 ENCOUNTER — Encounter (HOSPITAL_COMMUNITY): Payer: Self-pay

## 2019-02-03 ENCOUNTER — Other Ambulatory Visit: Payer: Self-pay

## 2019-02-03 DIAGNOSIS — Z79899 Other long term (current) drug therapy: Secondary | ICD-10-CM | POA: Diagnosis not present

## 2019-02-03 DIAGNOSIS — Z20828 Contact with and (suspected) exposure to other viral communicable diseases: Secondary | ICD-10-CM | POA: Diagnosis not present

## 2019-02-03 DIAGNOSIS — F17210 Nicotine dependence, cigarettes, uncomplicated: Secondary | ICD-10-CM | POA: Insufficient documentation

## 2019-02-03 DIAGNOSIS — R197 Diarrhea, unspecified: Secondary | ICD-10-CM | POA: Insufficient documentation

## 2019-02-03 LAB — SARS CORONAVIRUS 2 BY RT PCR (HOSPITAL ORDER, PERFORMED IN ~~LOC~~ HOSPITAL LAB): SARS Coronavirus 2: NEGATIVE

## 2019-02-03 NOTE — Discharge Instructions (Signed)
You were tested for Covid19 today while in the emergency department. This test will be available in 1-2 days. If the test is positive, you will be called. I have provided a note to return to work in case test results return negative.

## 2019-02-03 NOTE — ED Triage Notes (Signed)
Patient states that he had abdominal pain and diarrhea x 4 days. Abdominal pain has subsided, but is still having a few episodes of diarrhea. 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

## 2019-02-03 NOTE — ED Provider Notes (Signed)
[facility]-EMERGENCY DEPT Provider Note   CSN: [id number] Arrival date & time: 02/03/19  1350    History   Chief Complaint Chief Complaint  Patient presents with  . Diarrhea  . work note    HPI Aaron Walker is a 43 y.o. male.     43 y.o male with a PMH of Asymptomatic anemia, duodenal ulcer presents to the ED with a chief complaint of diarrhea x 4 days. He reports eating "something funky" on Sunday and having several episodes of diarrhea since. He states there is no blood in his stool. He has been drinking sprite along with chicken soup and reports his diarrhea has improved. He reports riding the bus to seek medical care therefore he did not attempt to seek care earlier. Patient is currently employed at [employer]. He denies any fever, nausea, vomiting, abdominal pain or other complaints.      Past Medical History:  Diagnosis Date  . Medical history non-contributory     Patient Active Problem List   Diagnosis Date Noted  . Positive H. pylori test 01/28/2017  . Symptomatic anemia   . Duodenal ulcer with hemorrhage   . Acute upper GI bleed 01/20/2017    Past Surgical History:  Procedure Laterality Date  . ESOPHAGOGASTRODUODENOSCOPY N/A 01/21/2017   Procedure: ESOPHAGOGASTRODUODENOSCOPY (EGD);  Surgeon: Sherrilyn Rist, MD;  Location: [facility] ENDOSCOPY;  Service: Endoscopy;  Laterality: N/A;  . NO PAST SURGERIES    . None          Home Medications    Prior to Admission medications   Medication Sig Start Date End Date Taking? Authorizing Provider  pantoprazole (PROTONIX) 40 MG tablet Take 1 tablet (40 mg total) by mouth 2 (two) times daily. 01/24/17   Reymundo Poll, MD    Family History Family History  Problem Relation Age of Onset  . Healthy Mother     Social History Social History   Tobacco Use  . Smoking status: Current Every Day Smoker    Packs/day: 0.50    Types: Cigarettes  . Smokeless tobacco: Never Used   Substance Use Topics  . Alcohol use: Yes    Comment: 1-2 40 oz daily  . Drug use: No     Allergies   Patient has no known allergies.   Review of Systems Review of Systems  Constitutional: Negative for chills and fever.  HENT: Negative for ear pain and sore throat.   Eyes: Negative for pain and visual disturbance.  Respiratory: Negative for cough and shortness of breath.   Cardiovascular: Negative for chest pain and palpitations.  Gastrointestinal: Positive for diarrhea. Negative for abdominal pain, blood in stool, nausea and vomiting.  Genitourinary: Negative for dysuria and hematuria.  Musculoskeletal: Negative for arthralgias and back pain.  Skin: Negative for color change and rash.  Neurological: Negative for seizures and syncope.  All other systems reviewed and are negative.    Physical Exam Updated Vital Signs BP 134/89 (BP Location: Left Arm)   Pulse 99   Temp 98.7 F (37.1 C) (Oral)   Resp 14   Ht 5\' 7"  (1.702 m)   Wt 72.6 kg   SpO2 100%   BMI 25.06 kg/m   Physical Exam Vitals signs and nursing note reviewed.  Constitutional:      Appearance: He is well-developed. He is not ill-appearing.     Comments: Well appearing.   HENT:     Head: Normocephalic and atraumatic.  Eyes:  General: No scleral icterus.    Pupils: Pupils are equal, round, and reactive to light.  Neck:     Musculoskeletal: Normal range of motion.  Cardiovascular:     Heart sounds: Normal heart sounds.  Pulmonary:     Effort: Pulmonary effort is normal.     Breath sounds: Normal breath sounds. No wheezing.  Chest:     Chest wall: No tenderness.  Abdominal:     General: Bowel sounds are normal. There is no distension.     Palpations: Abdomen is soft.     Tenderness: There is no abdominal tenderness.     Comments: Abdomen is soft, non tender to palpation, bowel sounds present.   Musculoskeletal:        General: No tenderness or deformity.  Skin:    General: Skin is warm and dry.   Neurological:     Mental Status: He is alert and oriented to person, place, and time.      ED Treatments / Results  Labs (all labs ordered are listed, but only abnormal results are displayed) Labs Reviewed  SARS CORONAVIRUS 2 (HOSPITAL ORDER, [facility])    EKG None  Radiology No results found.  Procedures Procedures (including critical care time)  Medications Ordered in ED Medications - No data to display   Initial Impression / Assessment and Plan / ED Course  I have reviewed the triage vital signs and the nursing notes.  Pertinent labs & imaging results that were available during my care of the patient were reviewed by me and considered in my medical decision making (see chart for details).     Patient currently an employee at [employer] which has had an outbreak of Covid 19 in the past week.  He reports his diarrhea has improved significantly he is able to tolerate p.o., has had decreased episodes and diarrhea.  Reports he is feeling better but will need a note to return back to work.  Due to patient's current line of work will obtain a COVID-19 testing, this will be sent to [employer] [employer] for testing.  He is advised that he should not return back to work until he gets results from this test.  He is advised if results are positive he will be called with them.  I will provide patient a note to return back to work by Friday, note does specify that he will need approval of a negative test.  Patient understands agrees with management.  No further management as patient is currently tolerating p.o., reports improvement in his symptoms. Return precautions.    Aaron Walker was evaluated in Emergency Department on 02/03/2019 for the symptoms described in the history of present illness. He was evaluated in the context of the global COVID-19 pandemic, which necessitated consideration that the patient might be at risk for infection with the SARS-CoV-2 virus  that causes COVID-19. Institutional protocols and algorithms that pertain to the evaluation of patients at risk for COVID-19 are in a state of rapid change based on information released by regulatory bodies including the CDC and federal and state organizations. These policies and algorithms were followed during the patient's care in the ED.   Final Clinical Impressions(s) / ED Diagnoses   Final diagnoses:  Diarrhea, unspecified type    ED Discharge Orders    None       Claude MangesSoto, Taner Rzepka, PA-C 02/03/19 1508    Jacalyn LefevreHaviland, Julie, MD 02/04/19 [phone]

## 2019-02-03 NOTE — ED Notes (Signed)
ED Provider at bedside. 

## 2019-06-07 ENCOUNTER — Emergency Department (HOSPITAL_COMMUNITY)
Admission: EM | Admit: 2019-06-07 | Discharge: 2019-06-07 | Disposition: A | Discharge status: Home / Self Care | Payer: Self-pay | Attending: Emergency Medicine | Admitting: Emergency Medicine

## 2019-06-07 ENCOUNTER — Encounter (HOSPITAL_COMMUNITY): Payer: Self-pay

## 2019-06-07 ENCOUNTER — Other Ambulatory Visit: Payer: Self-pay

## 2019-06-07 ENCOUNTER — Emergency Department (HOSPITAL_COMMUNITY): Payer: Self-pay

## 2019-06-07 DIAGNOSIS — Z79899 Other long term (current) drug therapy: Secondary | ICD-10-CM | POA: Insufficient documentation

## 2019-06-07 DIAGNOSIS — F10920 Alcohol use, unspecified with intoxication, uncomplicated: Secondary | ICD-10-CM | POA: Insufficient documentation

## 2019-06-07 DIAGNOSIS — F17210 Nicotine dependence, cigarettes, uncomplicated: Secondary | ICD-10-CM | POA: Insufficient documentation

## 2019-06-07 DIAGNOSIS — R079 Chest pain, unspecified: Secondary | ICD-10-CM | POA: Insufficient documentation

## 2019-06-07 DIAGNOSIS — M79605 Pain in left leg: Secondary | ICD-10-CM | POA: Insufficient documentation

## 2019-06-07 LAB — URINALYSIS, ROUTINE W REFLEX MICROSCOPIC
Bilirubin Urine: NEGATIVE
Glucose, UA: NEGATIVE mg/dL
Hgb urine dipstick: NEGATIVE
Ketones, ur: NEGATIVE mg/dL
Leukocytes,Ua: NEGATIVE
Nitrite: NEGATIVE
Protein, ur: NEGATIVE mg/dL
Specific Gravity, Urine: 1.004 — ABNORMAL LOW (ref 1.005–1.030)
pH: 5 (ref 5.0–8.0)

## 2019-06-07 LAB — CBC WITH DIFFERENTIAL/PLATELET
Abs Immature Granulocytes: 0.05 10*3/uL (ref 0.00–0.07)
Basophils Absolute: 0 10*3/uL (ref 0.0–0.1)
Basophils Relative: 0 %
Eosinophils Absolute: 0 10*3/uL (ref 0.0–0.5)
Eosinophils Relative: 0 %
HCT: 41.6 % (ref 39.0–52.0)
Hemoglobin: 14.6 g/dL (ref 13.0–17.0)
Immature Granulocytes: 1 %
Lymphocytes Relative: 34 %
Lymphs Abs: 2.5 10*3/uL (ref 0.7–4.0)
MCH: 32.7 pg (ref 26.0–34.0)
MCHC: 35.1 g/dL (ref 30.0–36.0)
MCV: 93.1 fL (ref 80.0–100.0)
Monocytes Absolute: 0.4 10*3/uL (ref 0.1–1.0)
Monocytes Relative: 5 %
Neutro Abs: 4.3 10*3/uL (ref 1.7–7.7)
Neutrophils Relative %: 60 %
Platelets: 123 10*3/uL — ABNORMAL LOW (ref 150–400)
RBC: 4.47 MIL/uL (ref 4.22–5.81)
RDW: 11.9 % (ref 11.5–15.5)
WBC: 7.3 10*3/uL (ref 4.0–10.5)
nRBC: 0 % (ref 0.0–0.2)

## 2019-06-07 LAB — RAPID URINE DRUG SCREEN, HOSP PERFORMED
Amphetamines: NOT DETECTED
Barbiturates: NOT DETECTED
Benzodiazepines: NOT DETECTED
Cocaine: NOT DETECTED
Opiates: NOT DETECTED
Tetrahydrocannabinol: NOT DETECTED

## 2019-06-07 LAB — COMPREHENSIVE METABOLIC PANEL
ALT: 31 U/L (ref 0–44)
AST: 50 U/L — ABNORMAL HIGH (ref 15–41)
Albumin: 4.8 g/dL (ref 3.5–5.0)
Alkaline Phosphatase: 76 U/L (ref 38–126)
Anion gap: 13 (ref 5–15)
BUN: 8 mg/dL (ref 6–20)
CO2: 22 mmol/L (ref 22–32)
Calcium: 9 mg/dL (ref 8.9–10.3)
Chloride: 104 mmol/L (ref 98–111)
Creatinine, Ser: 0.96 mg/dL (ref 0.61–1.24)
GFR calc Af Amer: >60 mL/min (ref >60)
GFR calc non Af Amer: >60 mL/min (ref >60)
Glucose, Bld: 92 mg/dL (ref 70–99)
Potassium: 3.9 mmol/L (ref 3.5–5.1)
Sodium: 139 mmol/L (ref 135–145)
Total Bilirubin: 0.5 mg/dL (ref 0.3–1.2)
Total Protein: 9.3 g/dL — ABNORMAL HIGH (ref 6.5–8.1)

## 2019-06-07 LAB — ETHANOL: Alcohol, Ethyl (B): 351 mg/dL

## 2019-06-07 NOTE — ED Notes (Signed)
Date and time results received: 06/07/19 1905 (use smartphrase ".now" to insert current time)  Test: alcohol Critical Value: 351  Name of Provider Notified: Magda Paganini PA  Orders Received? Or Actions Taken?: gave report of alcohol 351 to [facility]

## 2019-06-07 NOTE — ED Triage Notes (Signed)
Left leg pain for 2 weeks no trauma voiced states ETOH use today steady gait noted states he walked two miles before EMS arrived. Also states shortness of breath.

## 2019-06-07 NOTE — ED Notes (Signed)
Patient is ambulatory with no assist and steady gait-called friend Aaron Walker to pick him up at Emergency entrance

## 2019-06-07 NOTE — ED Provider Notes (Signed)
Aaron Walker Provider Note   CSN: [id number] Arrival date & time: 06/07/19  1645     History   Chief Complaint Chief Complaint  Patient presents with  . Leg Pain    HPI Aaron Walker is a 43 y.o. male.     Pt complains of pain in his left leg and pain in his chest.  Pt reports he has to walk leaning to the side due to pain.  Pt  States he has had a ulcer in the past.  Pt denies alcohol.   The history is provided by the patient. No language interpreter was used.  Leg Pain Location:  Knee Knee location:  L knee Pain details:    Quality:  Aching Relieved by:  Nothing Ineffective treatments:  None tried Associated symptoms: no back pain   Risk factors: no concern for non-accidental trauma     Past Medical History:  Diagnosis Date  . Medical history non-contributory     Patient Active Problem List   Diagnosis Date Noted  . Positive H. pylori test 01/28/2017  . Symptomatic anemia   . Duodenal ulcer with hemorrhage   . Acute upper GI bleed 01/20/2017    Past Surgical History:  Procedure Laterality Date  . ESOPHAGOGASTRODUODENOSCOPY N/A 01/21/2017   Procedure: ESOPHAGOGASTRODUODENOSCOPY (EGD);  Surgeon: Sherrilyn RistHenry L Danis III, MD;  [facility];  Service: Endoscopy;  Laterality: N/A;  . NO PAST SURGERIES    . None          Home Medications    Prior to Admission medications   Medication Sig Start Date End Date Taking? Authorizing Provider  pantoprazole (PROTONIX) 40 MG tablet Take 1 tablet (40 mg total) by mouth 2 (two) times daily. 01/24/17   Reymundo PollGuilloud, Carolyn, MD    Family History Family History  Problem Relation Age of Onset  . Healthy Mother     Social History Social History   Tobacco Use  . Smoking status: Current Every Day Smoker    Packs/day: 0.50    Types: Cigarettes  . Smokeless tobacco: Never Used  Substance Use Topics  . Alcohol use: Yes    Comment: 1-2 40 oz daily  . Drug use: No      Allergies   Patient has no known allergies.   Review of Systems Review of Systems  Musculoskeletal: Negative for back pain.  All other systems reviewed and are negative.    Physical Exam Updated Vital Signs BP 122/80   Pulse (!) 105   Temp 97.8 F (36.6 C) (Oral)   Ht 5\' 10"  (1.778 m)   Wt 72.6 kg   SpO2 96%   BMI 22.97 kg/m   Physical Exam Vitals signs and nursing note reviewed.  Constitutional:      Appearance: He is well-developed.  HENT:     Head: Normocephalic.     Mouth/Throat:     Mouth: Mucous membranes are moist.  Neck:     Musculoskeletal: Normal range of motion.  Cardiovascular:     Rate and Rhythm: Normal rate and regular rhythm.  Pulmonary:     Effort: Pulmonary effort is normal.  Abdominal:     General: There is no distension.  Musculoskeletal: Normal range of motion.        General: No swelling or deformity.  Neurological:     Mental Status: He is alert and oriented to person, place, and time.  Psychiatric:        Mood and Affect: Mood normal.  ED Treatments / Results  Labs (all labs ordered are listed, but only abnormal results are displayed) Labs Reviewed  CBC WITH DIFFERENTIAL/PLATELET - Abnormal; Notable for the following components:      Result Value   Platelets 123 (*)    All other components within normal limits  COMPREHENSIVE METABOLIC PANEL - Abnormal; Notable for the following components:   Total Protein 9.3 (*)    AST 50 (*)    All other components within normal limits  ETHANOL - Abnormal; Notable for the following components:   Alcohol, Ethyl (B) 351 (*)    All other components within normal limits  URINALYSIS, ROUTINE W REFLEX MICROSCOPIC - Abnormal; Notable for the following components:   Color, Urine STRAW (*)    Specific Gravity, Urine 1.004 (*)    All other components within normal limits  RAPID URINE DRUG SCREEN, HOSP PERFORMED    EKG None  Radiology Dg Chest 2 View  Result Date: 06/07/2019 CLINICAL  DATA:  Chest pain and numbness EXAM: CHEST - 2 VIEW COMPARISON:  01/20/2017 FINDINGS: Deformity of the right distal clavicle, stable. The lungs appear clear.  Cardiac and mediastinal contours normal. No pleural effusion identified. IMPRESSION: 1.  No active cardiopulmonary disease is radiographically apparent. Electronically Signed   By: Van Clines M.D.   On: 06/07/2019 18:20    Procedures Procedures (including critical care time)  Medications Ordered in ED Medications - No data to display   Initial Impression / Assessment and Plan / ED Course  I have reviewed the triage vital signs and the nursing notes.  Pertinent labs & imaging results that were available during my care of the patient were reviewed by me and considered in my medical decision making (see chart for details).        MDM   Chest xray is normal  Etoh is 351.    Final Clinical Impressions(s) / ED Diagnoses   Final diagnoses:  Alcoholic intoxication without complication ([place])  Left leg pain    ED Discharge Orders    None    Return if any problems.    Aaron Walker, [place] 06/07/19 1936    Daleen Bo, MD 06/08/19 1539

## 2019-09-28 ENCOUNTER — Emergency Department (HOSPITAL_COMMUNITY)
Admission: EM | Admit: 2019-09-28 | Discharge: 2019-09-28 | Disposition: A | Discharge status: Home / Self Care | Payer: Self-pay | Attending: Emergency Medicine | Admitting: Emergency Medicine

## 2019-09-28 ENCOUNTER — Other Ambulatory Visit: Payer: Self-pay

## 2019-09-28 ENCOUNTER — Emergency Department (HOSPITAL_COMMUNITY): Payer: Self-pay

## 2019-09-28 ENCOUNTER — Encounter (HOSPITAL_COMMUNITY): Payer: Self-pay

## 2019-09-28 DIAGNOSIS — R070 Pain in throat: Secondary | ICD-10-CM | POA: Insufficient documentation

## 2019-09-28 DIAGNOSIS — R059 Cough, unspecified: Secondary | ICD-10-CM

## 2019-09-28 DIAGNOSIS — Z20828 Contact with and (suspected) exposure to other viral communicable diseases: Secondary | ICD-10-CM | POA: Insufficient documentation

## 2019-09-28 DIAGNOSIS — R05 Cough: Secondary | ICD-10-CM

## 2019-09-28 DIAGNOSIS — F17210 Nicotine dependence, cigarettes, uncomplicated: Secondary | ICD-10-CM | POA: Insufficient documentation

## 2019-09-28 DIAGNOSIS — J189 Pneumonia, unspecified organism: Secondary | ICD-10-CM | POA: Insufficient documentation

## 2019-09-28 HISTORY — DX: Gastric ulcer, unspecified as acute or chronic, without hemorrhage or perforation: K25.9

## 2019-09-28 LAB — INFLUENZA PANEL BY PCR (TYPE A & B)
Influenza A By PCR: NEGATIVE
Influenza B By PCR: NEGATIVE

## 2019-09-28 MED ORDER — CETIRIZINE HCL 10 MG PO TABS: 10.0000 mg | ORAL_TABLET | Freq: Every day | ORAL | 0 refills | Status: DC

## 2019-09-28 MED ORDER — DOXYCYCLINE HYCLATE 100 MG PO TABS
100.0000 mg | ORAL_TABLET | Freq: Once | ORAL | Status: AC
  Administered 2019-09-28: 100 mg via ORAL
  Filled 2019-09-28: qty 1

## 2019-09-28 MED ORDER — BENZONATATE 100 MG PO CAPS: 100.0000 mg | ORAL_CAPSULE | Freq: Three times a day (TID) | ORAL | 0 refills | Status: DC

## 2019-09-28 MED ORDER — DOXYCYCLINE HYCLATE 100 MG PO CAPS: 100.0000 mg | ORAL_CAPSULE | Freq: Two times a day (BID) | ORAL | 0 refills | Status: DC

## 2019-09-28 NOTE — ED Provider Notes (Signed)
[facility] DEPT Provider Note   CSN: [id number] Arrival date & time: 09/28/19  1346     History Chief Complaint  Patient presents with  . Cough  . Sore Throat    Aristides Luckey is a 43 y.o. male with history of gastric ulcer and H. pylori who presents with a 3-week history of cough.  It has been productive at times with white sputum.  He has had some scratchy throat and intermittent nasal congestion.  His symptoms are worse at night.  He denies any shortness of breath.  He has had some chest pain only with coughing.  He has felt hot and cold, but no documented fever.  No medications taken prior to arrival.  He reports it could be related to allergies as he works around [employer] and is smelling smoke all day.  The place where he lives is requiring him to be tested for Covid and the flu.  HPI     Past Medical History:  Diagnosis Date  . Gastric ulcer   . Medical history non-contributory     Patient Active Problem List   Diagnosis Date Noted  . Positive H. pylori test 01/28/2017  . Symptomatic anemia   . Duodenal ulcer with hemorrhage   . Acute upper GI bleed 01/20/2017    Past Surgical History:  Procedure Laterality Date  . ESOPHAGOGASTRODUODENOSCOPY N/A 01/21/2017   Procedure: ESOPHAGOGASTRODUODENOSCOPY (EGD);  Surgeon: Doran Stabler, MD;  Location: [facility] ENDOSCOPY;  Service: Endoscopy;  Laterality: N/A;  . NO PAST SURGERIES    . None         Family History  Problem Relation Age of Onset  . Healthy Mother     Social History   Tobacco Use  . Smoking status: Current Every Day Smoker    Packs/day: 0.50    Types: Cigarettes  . Smokeless tobacco: Never Used  Substance Use Topics  . Alcohol use: Yes    Comment: 1-2 40 oz daily  . Drug use: No    Home Medications Prior to Admission medications   Medication Sig Start Date End Date Taking? Authorizing Provider  benzonatate (TESSALON) 100 MG capsule Take 1 capsule (100 mg total)  by mouth every 8 (eight) hours. 09/28/19   Ailah Barna, Bea Graff, PA-C  cetirizine (ZYRTEC ALLERGY) 10 MG tablet Take 1 tablet (10 mg total) by mouth daily. 09/28/19   Obediah Welles, Bea Graff, PA-C  doxycycline (VIBRAMYCIN) 100 MG capsule Take 1 capsule (100 mg total) by mouth 2 (two) times daily. 09/28/19   Satori Krabill, Bea Graff, PA-C  pantoprazole (PROTONIX) 40 MG tablet Take 1 tablet (40 mg total) by mouth 2 (two) times daily. 01/24/17   Velna Ochs, MD    Allergies    Patient has no known allergies.  Review of Systems   Review of Systems  Constitutional: Positive for chills. Negative for fever.  HENT: Positive for congestion. Negative for facial swelling and sore throat (scratchy).   Respiratory: Positive for cough. Negative for shortness of breath.   Cardiovascular: Positive for chest pain (with coughing only).  Gastrointestinal: Negative for abdominal pain, nausea and vomiting.  Genitourinary: Negative for dysuria.  Musculoskeletal: Negative for back pain.  Skin: Negative for rash and wound.  Neurological: Negative for headaches.  Psychiatric/Behavioral: The patient is not nervous/anxious.     Physical Exam Updated Vital Signs BP 113/81 (BP Location: Left Arm)   Pulse 79   Temp 98.7 F (37.1 C) (Oral)   Resp 16  Ht 5\' 7"  (1.702 m)   Wt 81.6 kg   SpO2 100%   BMI 28.19 kg/m   Physical Exam Vitals and nursing note reviewed.  Constitutional:      General: He is not in acute distress.    Appearance: He is well-developed. He is not diaphoretic.  HENT:     Head: Normocephalic and atraumatic.     Mouth/Throat:     Pharynx: No oropharyngeal exudate.     Tonsils: No tonsillar exudate or tonsillar abscesses. 1+ on the right. 1+ on the left.  Eyes:     General: No scleral icterus.       Right eye: No discharge.        Left eye: No discharge.     Conjunctiva/sclera: Conjunctivae normal.     Pupils: Pupils are equal, round, and reactive to light.  Neck:     Thyroid: No thyromegaly.   Cardiovascular:     Rate and Rhythm: Normal rate and regular rhythm.     Heart sounds: Normal heart sounds. No murmur. No friction rub. No gallop.   Pulmonary:     Effort: Pulmonary effort is normal. No respiratory distress.     Breath sounds: Normal breath sounds. No stridor. No wheezing or rales.  Abdominal:     General: Bowel sounds are normal. There is no distension.     Palpations: Abdomen is soft.     Tenderness: There is no abdominal tenderness. There is no guarding or rebound.  Musculoskeletal:     Cervical back: Normal range of motion and neck supple.  Lymphadenopathy:     Cervical: No cervical adenopathy.  Skin:    General: Skin is warm and dry.     Coloration: Skin is not pale.     Findings: No rash.  Neurological:     Mental Status: He is alert.     Coordination: Coordination normal.     ED Results / Procedures / Treatments   Labs (all labs ordered are listed, but only abnormal results are displayed) Labs Reviewed  NOVEL CORONAVIRUS, NAA (HOSP ORDER, SEND-OUT TO REF LAB; TAT 18-24 HRS)  INFLUENZA PANEL BY PCR (TYPE A & B)    EKG None  Radiology DG Chest Portable 1 View  Result Date: 09/28/2019 CLINICAL DATA:  Cough for several weeks EXAM: PORTABLE CHEST 1 VIEW COMPARISON:  2018 FINDINGS: Small patchy density at the right lung base. No pleural effusion or pneumothorax. Cardiomediastinal silhouette is within normal limits including normal heart size. IMPRESSION: Small patchy density at the right lung base may reflect atelectasis or consolidation. Follow-up is recommended to ensure resolution. Electronically Signed   By: Guadlupe SpanishPraneil  Patel M.D.   On: 09/28/2019 18:38    Procedures Procedures (including critical care time)  Medications Ordered in ED Medications  doxycycline (VIBRA-TABS) tablet 100 mg (has no administration in time range)    ED Course  I have reviewed the triage vital signs and the nursing notes.  Pertinent labs & imaging results that were  available during my care of the patient were reviewed by me and considered in my medical decision making (see chart for details).    MDM Rules/Calculators/A&P                      Patient presenting with a 3 to 4-week history of cough.  It is occasionally productive.  Chest x-ray shows small patchy density at the right lung base that may reflect atelectasis or consolidation.  Will cover with  doxycycline.  Recommended repeat chest x-ray in 4 weeks to assess resolution.  Given follow-up to PCP.  Will discharge home on Zyrtec for possible allergy component and Tessalon.  Flu and Covid pending.  Return precautions discussed.  Patient understands agrees with plan.  Patient vitals stable throughout ED course and discharged in satisfactory condition.  Final Clinical Impression(s) / ED Diagnoses Final diagnoses:  Community acquired pneumonia of right lower lobe of lung  Cough    Rx / DC Orders ED Discharge Orders         Ordered    doxycycline (VIBRAMYCIN) 100 MG capsule  2 times daily     09/28/19 1858    benzonatate (TESSALON) 100 MG capsule  Every 8 hours     09/28/19 1858    cetirizine (ZYRTEC ALLERGY) 10 MG tablet  Daily     09/28/19 1858           Verdis Prime 09/28/19 1904    Benjiman Core, MD 09/28/19 2242

## 2019-09-28 NOTE — ED Notes (Signed)
Pt refused discharge vital signs

## 2019-09-28 NOTE — Discharge Instructions (Addendum)
Take doxycycline as prescribed until completed.  Take Tessalon every 8 hours as needed for cough.  Take Zyrtec once daily for suspected allergy component.  You will be called if you test positive for flu Covid 19.  Please return the emergency department if you develop any new or worsening symptom.  Please establish care with a primary care provider for follow-up x-ray in about 4 weeks to make sure the area concerning for pneumonia is resolving.  Try to quit smoking.

## 2019-09-28 NOTE — ED Triage Notes (Signed)
Patient c/o a productive cough with white sputum and a sore throat. Patient states his cough is worse at night. Patient denies any fever or SOB.

## 2019-09-29 LAB — NOVEL CORONAVIRUS, NAA (HOSP ORDER, SEND-OUT TO REF LAB; TAT 18-24 HRS): SARS-CoV-2, NAA: NOT DETECTED

## 2019-11-23 ENCOUNTER — Ambulatory Visit: Payer: Self-pay

## 2019-11-24 ENCOUNTER — Ambulatory Visit: Payer: Self-pay

## 2020-12-12 ENCOUNTER — Encounter: Payer: Self-pay | Admitting: Family

## 2020-12-12 NOTE — Progress Notes (Signed)
Patient did not show for appointment.   

## 2021-02-22 ENCOUNTER — Encounter (HOSPITAL_COMMUNITY): Payer: Self-pay | Admitting: Emergency Medicine

## 2021-02-22 ENCOUNTER — Emergency Department (HOSPITAL_COMMUNITY): Payer: Medicaid Other

## 2021-02-22 ENCOUNTER — Other Ambulatory Visit: Payer: Self-pay

## 2021-02-22 ENCOUNTER — Inpatient Hospital Stay (HOSPITAL_COMMUNITY)
Admission: EM | Admit: 2021-02-22 | Discharge: 2021-02-27 | DRG: 641 | Disposition: A | Discharge status: Home / Self Care | Payer: Medicaid Other | Attending: Internal Medicine | Admitting: Internal Medicine

## 2021-02-22 DIAGNOSIS — R059 Cough, unspecified: Secondary | ICD-10-CM | POA: Diagnosis present

## 2021-02-22 DIAGNOSIS — N179 Acute kidney failure, unspecified: Secondary | ICD-10-CM | POA: Diagnosis present

## 2021-02-22 DIAGNOSIS — E871 Hypo-osmolality and hyponatremia: Principal | ICD-10-CM | POA: Diagnosis present

## 2021-02-22 DIAGNOSIS — F10120 Alcohol abuse with intoxication, uncomplicated: Secondary | ICD-10-CM | POA: Diagnosis present

## 2021-02-22 DIAGNOSIS — F10920 Alcohol use, unspecified with intoxication, uncomplicated: Secondary | ICD-10-CM

## 2021-02-22 DIAGNOSIS — Z79899 Other long term (current) drug therapy: Secondary | ICD-10-CM

## 2021-02-22 DIAGNOSIS — K219 Gastro-esophageal reflux disease without esophagitis: Secondary | ICD-10-CM | POA: Diagnosis present

## 2021-02-22 DIAGNOSIS — I10 Essential (primary) hypertension: Secondary | ICD-10-CM

## 2021-02-22 DIAGNOSIS — Y906 Blood alcohol level of 120-199 mg/100 ml: Secondary | ICD-10-CM | POA: Diagnosis present

## 2021-02-22 DIAGNOSIS — S0003XA Contusion of scalp, initial encounter: Secondary | ICD-10-CM | POA: Diagnosis present

## 2021-02-22 DIAGNOSIS — I959 Hypotension, unspecified: Secondary | ICD-10-CM | POA: Diagnosis present

## 2021-02-22 DIAGNOSIS — D649 Anemia, unspecified: Secondary | ICD-10-CM | POA: Diagnosis present

## 2021-02-22 DIAGNOSIS — F1410 Cocaine abuse, uncomplicated: Secondary | ICD-10-CM

## 2021-02-22 DIAGNOSIS — R079 Chest pain, unspecified: Secondary | ICD-10-CM | POA: Diagnosis present

## 2021-02-22 DIAGNOSIS — Z20822 Contact with and (suspected) exposure to covid-19: Secondary | ICD-10-CM | POA: Diagnosis present

## 2021-02-22 DIAGNOSIS — F17210 Nicotine dependence, cigarettes, uncomplicated: Secondary | ICD-10-CM | POA: Diagnosis present

## 2021-02-22 DIAGNOSIS — F10129 Alcohol abuse with intoxication, unspecified: Secondary | ICD-10-CM | POA: Diagnosis present

## 2021-02-22 DIAGNOSIS — W19XXXA Unspecified fall, initial encounter: Secondary | ICD-10-CM | POA: Diagnosis present

## 2021-02-22 DIAGNOSIS — E876 Hypokalemia: Secondary | ICD-10-CM | POA: Diagnosis present

## 2021-02-22 LAB — CBC WITH DIFFERENTIAL/PLATELET
Abs Immature Granulocytes: 0.04 10*3/uL (ref 0.00–0.07)
Basophils Absolute: 0 10*3/uL (ref 0.0–0.1)
Basophils Relative: 1 %
Eosinophils Absolute: 0 10*3/uL (ref 0.0–0.5)
Eosinophils Relative: 1 %
HCT: 29.3 % — ABNORMAL LOW (ref 39.0–52.0)
Hemoglobin: 11 g/dL — ABNORMAL LOW (ref 13.0–17.0)
Immature Granulocytes: 1 %
Lymphocytes Relative: 34 %
Lymphs Abs: 2.2 10*3/uL (ref 0.7–4.0)
MCH: 33.4 pg (ref 26.0–34.0)
MCHC: 37.5 g/dL — ABNORMAL HIGH (ref 30.0–36.0)
MCV: 89.1 fL (ref 80.0–100.0)
Monocytes Absolute: 0.5 10*3/uL (ref 0.1–1.0)
Monocytes Relative: 7 %
Neutro Abs: 3.8 10*3/uL (ref 1.7–7.7)
Neutrophils Relative %: 56 %
Platelets: 255 10*3/uL (ref 150–400)
RBC: 3.29 MIL/uL — ABNORMAL LOW (ref 4.22–5.81)
RDW: 11.8 % (ref 11.5–15.5)
WBC: 6.6 10*3/uL (ref 4.0–10.5)
nRBC: 0 % (ref 0.0–0.2)

## 2021-02-22 LAB — RAPID URINE DRUG SCREEN, HOSP PERFORMED
Amphetamines: NOT DETECTED
Barbiturates: NOT DETECTED
Benzodiazepines: NOT DETECTED
Cocaine: POSITIVE — AB
Opiates: NOT DETECTED
Tetrahydrocannabinol: NOT DETECTED

## 2021-02-22 LAB — URINALYSIS, ROUTINE W REFLEX MICROSCOPIC
Bilirubin Urine: NEGATIVE
Glucose, UA: NEGATIVE mg/dL
Ketones, ur: NEGATIVE mg/dL
Nitrite: NEGATIVE
Protein, ur: NEGATIVE mg/dL
Specific Gravity, Urine: 1.003 — ABNORMAL LOW (ref 1.005–1.030)
pH: 6 (ref 5.0–8.0)

## 2021-02-22 LAB — COMPREHENSIVE METABOLIC PANEL
ALT: 19 U/L (ref 0–44)
AST: 28 U/L (ref 15–41)
Albumin: 3.7 g/dL (ref 3.5–5.0)
Alkaline Phosphatase: 67 U/L (ref 38–126)
Anion gap: 14 (ref 5–15)
BUN: 16 mg/dL (ref 6–20)
CO2: 23 mmol/L (ref 22–32)
Calcium: 8.9 mg/dL (ref 8.9–10.3)
Chloride: 81 mmol/L — ABNORMAL LOW (ref 98–111)
Creatinine, Ser: 2.07 mg/dL — ABNORMAL HIGH (ref 0.61–1.24)
GFR, Estimated: 40 mL/min — ABNORMAL LOW (ref >60)
Glucose, Bld: 87 mg/dL (ref 70–99)
Potassium: 3.4 mmol/L — ABNORMAL LOW (ref 3.5–5.1)
Sodium: 118 mmol/L — CL (ref 135–145)
Total Bilirubin: 0.5 mg/dL (ref 0.3–1.2)
Total Protein: 7.8 g/dL (ref 6.5–8.1)

## 2021-02-22 LAB — LACTIC ACID, PLASMA
Lactic Acid, Venous: 2.8 mmol/L (ref 0.5–1.9)
Lactic Acid, Venous: 2.5 mmol/L (ref 0.5–1.9)

## 2021-02-22 LAB — ETHANOL: Alcohol, Ethyl (B): 196 mg/dL — ABNORMAL HIGH (ref <10)

## 2021-02-22 LAB — TROPONIN I (HIGH SENSITIVITY)
Troponin I (High Sensitivity): 3 ng/L (ref <18)
Troponin I (High Sensitivity): 5 ng/L (ref <18)

## 2021-02-22 MED ORDER — PANTOPRAZOLE SODIUM 40 MG PO TBEC
40.0000 mg | DELAYED_RELEASE_TABLET | Freq: Two times a day (BID) | ORAL | Status: DC
  Administered 2021-02-23 – 2021-02-27 (×9): 40 mg via ORAL
  Filled 2021-02-22 (×9): qty 1

## 2021-02-22 MED ORDER — LORAZEPAM 1 MG PO TABS
1.0000 mg | ORAL_TABLET | ORAL | Status: AC | PRN
  Administered 2021-02-24: 1 mg via ORAL
  Filled 2021-02-22: qty 1

## 2021-02-22 MED ORDER — SODIUM CHLORIDE 0.9 % IV BOLUS
30.0000 mL/kg | Freq: Once | INTRAVENOUS | Status: AC
  Administered 2021-02-22: 2178 mL via INTRAVENOUS

## 2021-02-22 MED ORDER — ONDANSETRON HCL 4 MG PO TABS: 4.0000 mg | ORAL_TABLET | Freq: Four times a day (QID) | ORAL | Status: DC | PRN

## 2021-02-22 MED ORDER — BENZONATATE 100 MG PO CAPS
100.0000 mg | ORAL_CAPSULE | Freq: Three times a day (TID) | ORAL | Status: DC | PRN
  Administered 2021-02-24 – 2021-02-25 (×2): 100 mg via ORAL
  Filled 2021-02-22 (×2): qty 1

## 2021-02-22 MED ORDER — ONDANSETRON HCL 4 MG/2ML IJ SOLN: 4.0000 mg | Freq: Four times a day (QID) | INTRAMUSCULAR | Status: DC | PRN

## 2021-02-22 MED ORDER — LORATADINE 10 MG PO TABS
10.0000 mg | ORAL_TABLET | Freq: Every day | ORAL | Status: DC
  Administered 2021-02-23 – 2021-02-27 (×5): 10 mg via ORAL
  Filled 2021-02-22 (×5): qty 1

## 2021-02-22 MED ORDER — LORAZEPAM 2 MG/ML IJ SOLN: 1.0000 mg | INTRAMUSCULAR | Status: AC | PRN

## 2021-02-22 MED ORDER — ADULT MULTIVITAMIN W/MINERALS CH
1.0000 | ORAL_TABLET | Freq: Every day | ORAL | Status: DC
  Administered 2021-02-23 – 2021-02-27 (×5): 1 via ORAL
  Filled 2021-02-22 (×5): qty 1

## 2021-02-22 MED ORDER — THIAMINE HCL 100 MG PO TABS
100.0000 mg | ORAL_TABLET | Freq: Every day | ORAL | Status: DC
  Administered 2021-02-23 – 2021-02-27 (×5): 100 mg via ORAL
  Filled 2021-02-22 (×5): qty 1

## 2021-02-22 MED ORDER — FOLIC ACID 1 MG PO TABS
1.0000 mg | ORAL_TABLET | Freq: Every day | ORAL | Status: DC
  Administered 2021-02-23 – 2021-02-27 (×5): 1 mg via ORAL
  Filled 2021-02-22 (×5): qty 1

## 2021-02-22 MED ORDER — ACETAMINOPHEN 325 MG PO TABS
650.0000 mg | ORAL_TABLET | Freq: Four times a day (QID) | ORAL | Status: AC | PRN
  Filled 2021-02-22: qty 2

## 2021-02-22 MED ORDER — THIAMINE HCL 100 MG/ML IJ SOLN: 100.0000 mg | Freq: Every day | INTRAMUSCULAR | Status: DC

## 2021-02-22 MED ORDER — ACETAMINOPHEN 650 MG RE SUPP: 650.0000 mg | Freq: Four times a day (QID) | RECTAL | Status: AC | PRN

## 2021-02-22 NOTE — ED Provider Notes (Signed)
[facility]   CSN: [id number] Arrival date & time: 02/22/21  1850     History Chief Complaint  Patient presents with  . Weakness    Aaron Walker is a 45 y.o. male.  Aaron Walker states that he drank half a beer and took trazodone.  When he went to ambulate to the bathroom, he fell.  He also states that he has been having some chest pain and cough for about a week.  The history is provided by the patient and the EMS personnel. The history is limited by the condition of the patient.  Fall This is a new problem. The current episode started less than 1 hour ago. The problem occurs constantly. The problem has been resolved. Associated symptoms include chest pain. Pertinent negatives include no abdominal pain, no headaches and no shortness of breath. Nothing aggravates the symptoms. Nothing relieves the symptoms. He has tried nothing for the symptoms. The treatment provided no relief.       Past Medical History:  Diagnosis Date  . Gastric ulcer   . Medical history non-contributory     Patient Active Problem List   Diagnosis Date Noted  . Positive H. pylori test 01/28/2017  . Symptomatic anemia   . Duodenal ulcer with hemorrhage   . Acute upper GI bleed 01/20/2017    Past Surgical History:  Procedure Laterality Date  . ESOPHAGOGASTRODUODENOSCOPY N/A 01/21/2017   Procedure: ESOPHAGOGASTRODUODENOSCOPY (EGD);  Surgeon: Sherrilyn RistHenry L Danis III, MD;  [facility];  Service: Endoscopy;  Laterality: N/A;  . NO PAST SURGERIES    . None         Family History  Problem Relation Age of Onset  . Healthy Mother     Social History   Tobacco Use  . Smoking status: Current Every Day Smoker    Packs/day: 0.50    Types: Cigarettes  . Smokeless tobacco: Never Used  Vaping Use  . Vaping Use: Never used  Substance Use Topics  . Alcohol use: Yes    Comment: 1-2 40 oz daily  . Drug use: No    Home Medications Prior to  Admission medications   Medication Sig Start Date End Date Taking? Authorizing Provider  benzonatate (TESSALON) 100 MG capsule Take 1 capsule (100 mg total) by mouth every 8 (eight) hours. 09/28/19   Law, Waylan BogaAlexandra M, PA-C  cetirizine (ZYRTEC ALLERGY) 10 MG tablet Take 1 tablet (10 mg total) by mouth daily. 09/28/19   Law, Waylan BogaAlexandra M, PA-C  doxycycline (VIBRAMYCIN) 100 MG capsule Take 1 capsule (100 mg total) by mouth 2 (two) times daily. 09/28/19   Law, Waylan BogaAlexandra M, PA-C  pantoprazole (PROTONIX) 40 MG tablet Take 1 tablet (40 mg total) by mouth 2 (two) times daily. 01/24/17   Reymundo PollGuilloud, Carolyn, MD    Allergies    Patient has no known allergies.  Review of Systems   Review of Systems  Constitutional: Negative for chills and fever.  HENT: Negative for ear pain and sore throat.   Eyes: Negative for pain and visual disturbance.  Respiratory: Positive for cough. Negative for shortness of breath.   Cardiovascular: Positive for chest pain. Negative for palpitations.  Gastrointestinal: Negative for abdominal pain and vomiting.  Genitourinary: Negative for dysuria and hematuria.  Musculoskeletal: Negative for arthralgias and back pain.  Skin: Negative for color change and rash.  Neurological: Positive for numbness (toes and feet; chronic). Negative for seizures, syncope and headaches.  All other systems reviewed and are  negative.   Physical Exam Updated Vital Signs BP 124/83 (BP Location: Right Arm)   Pulse (!) 102   Temp 97.9 F (36.6 C) (Oral)   Resp (!) 23   Ht 5\' 7"  (1.702 m)   Wt 72.6 kg   SpO2 100%   BMI 25.06 kg/m   Physical Exam Vitals and nursing note reviewed.  Constitutional:      Appearance: He is well-developed.  HENT:     Head: Normocephalic and atraumatic.  Eyes:     Conjunctiva/sclera: Conjunctivae normal.  Cardiovascular:     Rate and Rhythm: Regular rhythm. Tachycardia present.     Heart sounds: No murmur heard.   Pulmonary:     Effort: Pulmonary effort  is normal. No respiratory distress.     Breath sounds: Normal breath sounds.  Abdominal:     Palpations: Abdomen is soft.     Tenderness: There is no abdominal tenderness.  Musculoskeletal:     Cervical back: Neck supple.  Skin:    General: Skin is warm and dry.  Neurological:     General: No focal deficit present.     Mental Status: He is alert and oriented to person, place, and time.     Cranial Nerves: No cranial nerve deficit.     Sensory: No sensory deficit.     Motor: No weakness.     Comments: No dysarthria  Psychiatric:     Comments: Appears intoxicated     ED Results / Procedures / Treatments   Labs (all labs ordered are listed, but only abnormal results are displayed) Labs Reviewed  ETHANOL - Abnormal; Notable for the following components:      Result Value   Alcohol, Ethyl (B) 196 (*)    All other components within normal limits  CBC WITH DIFFERENTIAL/PLATELET - Abnormal; Notable for the following components:   RBC 3.29 (*)    Hemoglobin 11.0 (*)    HCT 29.3 (*)    MCHC 37.5 (*)    All other components within normal limits  LACTIC ACID, PLASMA - Abnormal; Notable for the following components:   Lactic Acid, Venous 2.8 (*)    All other components within normal limits  LACTIC ACID, PLASMA - Abnormal; Notable for the following components:   Lactic Acid, Venous 2.5 (*)    All other components within normal limits  RAPID URINE DRUG SCREEN, HOSP PERFORMED - Abnormal; Notable for the following components:   Cocaine POSITIVE (*)    All other components within normal limits  URINALYSIS, ROUTINE W REFLEX MICROSCOPIC - Abnormal; Notable for the following components:   APPearance HAZY (*)    Specific Gravity, Urine 1.003 (*)    Hgb urine dipstick LARGE (*)    Leukocytes,Ua LARGE (*)    Bacteria, UA MANY (*)    All other components within normal limits  COMPREHENSIVE METABOLIC PANEL - Abnormal; Notable for the following components:   Sodium 118 (*)    Potassium 3.4  (*)    Chloride 81 (*)    Creatinine, Ser 2.07 (*)    GFR, Estimated 40 (*)    All other components within normal limits  RESP PANEL BY RT-PCR (FLU A&B, COVID) ARPGX2  HIV ANTIBODY (ROUTINE TESTING W REFLEX)  BASIC METABOLIC PANEL  CBC  MAGNESIUM  PHOSPHORUS  BASIC METABOLIC PANEL  TROPONIN I (HIGH SENSITIVITY)  TROPONIN I (HIGH SENSITIVITY)    EKG EKG Interpretation  Date/Time:  Wednesday Feb 22 2021 18:51:32 EDT Ventricular Rate:  84 PR Interval:  184 QRS Duration: 83 QT Interval:  391 QTC Calculation: 463 R Axis:   40 Text Interpretation: Sinus rhythm Artifact in lead(s) I II III aVR aVL aVF V1 V2 V6 QTc normal no acute ischemia Artifact obscures interpretation Confirmed by Pieter Partridge ([id number]) on 02/22/2021 7:28:13 PM   Radiology CT Head Wo Contrast  Result Date: 02/22/2021 CLINICAL DATA:  Fall.  Intoxication. EXAM: CT HEAD WITHOUT CONTRAST CT CERVICAL SPINE WITHOUT CONTRAST TECHNIQUE: Multidetector CT imaging of the head and cervical spine was performed following the standard protocol without intravenous contrast. Multiplanar CT image reconstructions of the cervical spine were also generated. COMPARISON:  None. FINDINGS: CT HEAD FINDINGS Brain: There is no evidence of an acute infarct, intracranial hemorrhage, mass, midline shift, or extra-axial fluid collection. There is mild cerebral atrophy. Vascular: No hyperdense vessel. Skull: No fracture or suspicious osseous lesion. Sinuses/Orbits: Visualized paranasal sinuses and mastoid air cells are clear. Unremarkable orbits. Other: Moderate occipital scalp hematoma primarily to the left of midline. CT CERVICAL SPINE FINDINGS The study is motion degraded including moderate to severe motion artifact through the C2-3 level. Alignment: Reversal of the normal cervical lordosis. No evidence of acute traumatic malalignment. Skull base and vertebrae: No acute fracture is identified, however assessment is limited by motion, particularly for a  nondisplaced or minimally displaced fracture in the upper cervical spine. No destructive osseous lesion. Soft tissues and spinal canal: No prevertebral fluid or swelling. No visible canal hematoma. Disc levels:  Mild cervical spondylosis. Upper chest: Clear lung apices. Other: None. IMPRESSION: 1. No evidence of acute intracranial abnormality. 2. Occipital scalp hematoma. 3. Motion degraded cervical spine CT without evidence of an acute fracture or traumatic malalignment. Electronically Signed   By: Sebastian Ache M.D.   On: 02/22/2021 20:21   CT Cervical Spine Wo Contrast  Result Date: 02/22/2021 CLINICAL DATA:  Fall.  Intoxication. EXAM: CT HEAD WITHOUT CONTRAST CT CERVICAL SPINE WITHOUT CONTRAST TECHNIQUE: Multidetector CT imaging of the head and cervical spine was performed following the standard protocol without intravenous contrast. Multiplanar CT image reconstructions of the cervical spine were also generated. COMPARISON:  None. FINDINGS: CT HEAD FINDINGS Brain: There is no evidence of an acute infarct, intracranial hemorrhage, mass, midline shift, or extra-axial fluid collection. There is mild cerebral atrophy. Vascular: No hyperdense vessel. Skull: No fracture or suspicious osseous lesion. Sinuses/Orbits: Visualized paranasal sinuses and mastoid air cells are clear. Unremarkable orbits. Other: Moderate occipital scalp hematoma primarily to the left of midline. CT CERVICAL SPINE FINDINGS The study is motion degraded including moderate to severe motion artifact through the C2-3 level. Alignment: Reversal of the normal cervical lordosis. No evidence of acute traumatic malalignment. Skull base and vertebrae: No acute fracture is identified, however assessment is limited by motion, particularly for a nondisplaced or minimally displaced fracture in the upper cervical spine. No destructive osseous lesion. Soft tissues and spinal canal: No prevertebral fluid or swelling. No visible canal hematoma. Disc levels:   Mild cervical spondylosis. Upper chest: Clear lung apices. Other: None. IMPRESSION: 1. No evidence of acute intracranial abnormality. 2. Occipital scalp hematoma. 3. Motion degraded cervical spine CT without evidence of an acute fracture or traumatic malalignment. Electronically Signed   By: Sebastian Ache M.D.   On: 02/22/2021 20:21   DG Chest Port 1 View  Result Date: 02/22/2021 CLINICAL DATA:  Unwitnessed fall. EXAM: PORTABLE CHEST 1 VIEW COMPARISON:  September 28, 2019 FINDINGS: The heart size and mediastinal contours are within normal limits. Both lungs are clear. A chronic  deformity is seen involving the distal right clavicle. IMPRESSION: No active disease. Electronically Signed   By: Aram Candela M.D.   On: 02/22/2021 20:03    Procedures Procedures   Medications Ordered in ED Medications  pantoprazole (PROTONIX) EC tablet 40 mg (has no administration in time range)  benzonatate (TESSALON) capsule 100 mg (has no administration in time range)  loratadine (CLARITIN) tablet 10 mg (has no administration in time range)  acetaminophen (TYLENOL) tablet 650 mg (has no administration in time range)    Or  acetaminophen (TYLENOL) suppository 650 mg (has no administration in time range)  ondansetron (ZOFRAN) tablet 4 mg (has no administration in time range)    Or  ondansetron (ZOFRAN) injection 4 mg (has no administration in time range)  LORazepam (ATIVAN) tablet 1-4 mg (has no administration in time range)    Or  LORazepam (ATIVAN) injection 1-4 mg (has no administration in time range)  thiamine tablet 100 mg (has no administration in time range)    Or  thiamine (B-1) injection 100 mg (has no administration in time range)  folic acid (FOLVITE) tablet 1 mg (has no administration in time range)  multivitamin with minerals tablet 1 tablet (has no administration in time range)  sodium chloride 0.9 % bolus 2,178 mL (2,178 mLs Intravenous New Bag/Given 02/22/21 2155)    ED Course  I have  reviewed the triage vital signs and the nursing notes.  Pertinent labs & imaging results that were available during my care of the patient were reviewed by me and considered in my medical decision making (see chart for details).  Clinical Course as of 02/22/21 2316  Wed Feb 22, 2021  2300 I spoke with Dr. Sedalia Muta of the hospitalist service.  [AW]    Clinical Course User Index [AW] Koleen Distance, MD   MDM Rules/Calculators/A&P                          Aaron Walker arrived to the emergency department via EMS after a fall.  He did admit to some alcohol use, and his history was complicated by clinical intoxication.  However, I was also concerned about possible head injury, and he was evaluated for evidence of trauma.  He was slightly tachycardic, and he was complaining of some chest pain that has been ongoing for about a week.  Therefore, chest x-ray as well as cardiac enzymes were ordered.  Initial lactic acid was slightly elevated, and he will be given IV fluids.  He will need to await repeat lactic acid in the ED and become clinically sober prior to discharge.  Unfortunately, the patient's CMP revealed that he was markedly hyponatremic and also had evidence of acute kidney injury.  The level of his hyponatremia will necessitate admission to the hospital.  The hospitalist group was consulted and will bring the patient in for admission.  Final Clinical Impression(s) / ED Diagnoses Final diagnoses:  Fall, initial encounter  Contusion of scalp, initial encounter  Alcoholic intoxication without complication (HCC)  Cocaine abuse (HCC)  Hyponatremia    Rx / DC Orders ED Discharge Orders    None       Koleen Distance, MD 02/22/21 2317

## 2021-02-22 NOTE — ED Notes (Signed)
Patient transported to CT 

## 2021-02-22 NOTE — H&P (Signed)
History and Physical   Aaron Walker MRN:[id number] DOB: 02/12/1976 DOA: 02/22/2021  PCP: Placey, Mary Ann, NP  Outpatient Specialists: none Patient coming from: home  I have personally briefly reviewed patient's old medical records in [facility] EMR.  Chief Concern: Weakness and a fall  HPI: Aaron Walker is a 45 y.o. male with medical history significant for hypertension, alcohol abuse, GERD, presents to the emergency department for chief concerns of weakness and falling.  He reports that he was walking to restroom and his legs gave out and he fell forward. He denies head trauma and loss of consciousness.  He reports this happens when he takes a sleeping pills and drinks alcohol.  He states that he does not know what the medication was and that someone gave it to him and he doesn't know what the name of the medication is.  He reports that he only had 1.5 years today prior to presentation to the ED.  He notes that normally he only drinks 6-8 beers per day.  At bedside, patient is actively intoxicated and slurring his speech. He was able to tell me his full name, his age, and that he is in the hospital, and the current year is 2022.  Social history: He lives with his unclesmokes tobacco 0.5 - 1 ppd, drinks 6-8 beers per day. He denies recreational drug use. He works as cook.   He denies history of withdrawal and/or seizures when he does not have alcohol.  He reports that sometimes he can go weeks without drinking alcohol.  Vaccination: he has had 1 or 2 doses of covid 19, and he does know  ROS: Constitutional: no weight change, no fever ENT/Mouth: no sore throat, no rhinorrhea Eyes: no eye pain, no vision changes Cardiovascular: no chest pain, no dyspnea,  no edema, no palpitations Respiratory: + cough, no sputum, no wheezing Gastrointestinal: no nausea, no vomiting, no diarrhea, no constipation Genitourinary: no urinary incontinence, no dysuria, no hematuria Musculoskeletal: no  arthralgias, no myalgias Skin: no skin lesions, no pruritus, Neuro: + weakness, no loss of consciousness, no syncope Psych: no anxiety, no depression, no decrease appetite Heme/Lymph: no bruising, no bleeding  ED Course: Discussed with ED provider, patient requiring hospitalization for hyponatremia.  Vitals in the emergency department showed a temperature of 97.9, respiration rate of 17 initially, heart rate 92, blood pressure 119/78, SPO2 of 100% on room air.  Labs in the emergency department was remarkable for sodium level of 118, potassium 3.4, chloride 81, bicarb 23, BUN 16, serum creatinine of 2.07, nonfasting blood glucose 87.  eGFR 40.  WBC 6.6, hemoglobin 11.0, platelets 255.  Initial high-sensitivity troponin was 5.  Lactic acid was initially 2.8 and improved to 2.5.  UA was positive for large hemoglobin and leukocytes.  Assessment/Plan  Active Problems:   Hyponatremia   Alcohol abuse with intoxication (HCC)   Essential hypertension   Electrolyte imbalance including sodium, chloride, potassium Hyponatremia -Slow correction, goal of 6-8 in 24 hours - Query beer potomania - Status post normal saline sepsis bolus, 2178 mL per EDP - No IVF at this time - Repeat BMP scheduled for 11:59 on day of admission - BMP in the a.m.  Acute kidney injury- - Serum creatinine on presentation was 2.07, BUN 16 - Baseline serum creatinine was 0.78-0.96 from 2018-2020 - UA showed large hemoglobin - Checking stat CK, doubt rhabdomyolysis given that patient is denying any musculoskeletal pain - BMP in the a.m.  Alcohol abuse with acute intoxication - Did not   attempt counseling as patient is acutely intoxicated - CIWA protocol initiated as patient states that he has been drinking less than his normal daily consumption - Thiamine 100 mg p.o. daily ordered, multivitamin 1 tablet ordered, folic acid 1 mg p.o. daily ordered  GERD-PPI  Left occipital scalp hematoma- no laceration on my  physical exam -Continue monitoring  Hypertension- controlled, patient states that he takes blood pressure medication at home that starts with the letter T - Patient does not remember the name of his antihypertensive medication - Hydralazine 25 mg p.o. every 8 hours as needed for SBP greater than 160, 2 days ordered  A.m. labs: BMP, CBC  COVID PCR is still pending  Chart reviewed.   DVT prophylaxis: TED hose, Heparin 5000 units every 8 hours, subcutaneous Code Status: Full code Diet: Heart healthy Family Communication: No Disposition Plan: Pending clinical course Consults called: None at this time Admission status: Observation, telemetry medical  Past Medical History:  Diagnosis Date  . Gastric ulcer   . Medical history non-contributory    Past Surgical History:  Procedure Laterality Date  . ESOPHAGOGASTRODUODENOSCOPY N/A 01/21/2017   Procedure: ESOPHAGOGASTRODUODENOSCOPY (EGD);  Surgeon: Doran Stabler, MD;  Location: [facility] ENDOSCOPY;  Service: Endoscopy;  Laterality: N/A;  . NO PAST SURGERIES    . None     Social History:  reports that he has been smoking cigarettes. He has been smoking about 0.50 packs per day. He has never used smokeless tobacco. He reports current alcohol use. He reports that he does not use drugs.  No Known Allergies Family History  Problem Relation Age of Onset  . Healthy Mother    Family history: Family history reviewed and not pertinent  Prior to Admission medications   Medication Sig Start Date End Date Taking? Authorizing Provider  benzonatate (TESSALON) 100 MG capsule Take 1 capsule (100 mg total) by mouth every 8 (eight) hours. 09/28/19   Law, Bea Graff, PA-C  cetirizine (ZYRTEC ALLERGY) 10 MG tablet Take 1 tablet (10 mg total) by mouth daily. 09/28/19   Law, Bea Graff, PA-C  doxycycline (VIBRAMYCIN) 100 MG capsule Take 1 capsule (100 mg total) by mouth 2 (two) times daily. 09/28/19   Law, Bea Graff, PA-C  pantoprazole (PROTONIX) 40 MG  tablet Take 1 tablet (40 mg total) by mouth 2 (two) times daily. 01/24/17   Velna Ochs, MD   Physical Exam: Vitals:   02/22/21 2000 02/22/21 2045 02/22/21 2130 02/22/21 2145  BP: 132/87 123/88 120/79 119/78  Pulse: 85 83 (!) 110 89  Resp: _0 Temp:      TempSrc:      SpO2: 100% 100% 100% 100%  Weight:      Height:       Constitutional: appears age-appropriate, NAD, calm, comfortable Eyes: PERRL, lids and conjunctivae normal ENMT: Mucous membranes are moist. Posterior pharynx clear of any exudate or lesions. Age-appropriate dentition. Hearing appropriate Neck: normal, supple, no masses, no thyromegaly Respiratory: clear to auscultation bilaterally, no wheezing, no crackles. Normal respiratory effort. No accessory muscle use.  Cardiovascular: Regular rate and rhythm, no murmurs / rubs / gallops. No extremity edema. 2+ pedal pulses. No carotid bruits.  Abdomen: no tenderness, no masses palpated, no hepatosplenomegaly. Bowel sounds positive.  Musculoskeletal: no clubbing / cyanosis. No joint deformity upper and lower extremities. Good ROM, no contractures, no atrophy. Normal muscle tone.  Skin: no rashes, lesions, ulcers. No induration Neurologic: Sensation intact. Strength 5/5 in all 4.  Psychiatric: Normal judgment  and insight. Alert and oriented x 3. Normal mood.   EKG: independently reviewed, showing sinus rhythm with rate of 84, QTc 463  Chest x-ray on Admission: I personally reviewed and I agree with radiologist reading as below.  CT Head Wo Contrast  Result Date: 02/22/2021 CLINICAL DATA:  Fall.  Intoxication. EXAM: CT HEAD WITHOUT CONTRAST CT CERVICAL SPINE WITHOUT CONTRAST TECHNIQUE: Multidetector CT imaging of the head and cervical spine was performed following the standard protocol without intravenous contrast. Multiplanar CT image reconstructions of the cervical spine were also generated. COMPARISON:  None. FINDINGS: CT HEAD FINDINGS Brain: There is no evidence  of an acute infarct, intracranial hemorrhage, mass, midline shift, or extra-axial fluid collection. There is mild cerebral atrophy. Vascular: No hyperdense vessel. Skull: No fracture or suspicious osseous lesion. Sinuses/Orbits: Visualized paranasal sinuses and mastoid air cells are clear. Unremarkable orbits. Other: Moderate occipital scalp hematoma primarily to the left of midline. CT CERVICAL SPINE FINDINGS The study is motion degraded including moderate to severe motion artifact through the C2-3 level. Alignment: Reversal of the normal cervical lordosis. No evidence of acute traumatic malalignment. Skull base and vertebrae: No acute fracture is identified, however assessment is limited by motion, particularly for a nondisplaced or minimally displaced fracture in the upper cervical spine. No destructive osseous lesion. Soft tissues and spinal canal: No prevertebral fluid or swelling. No visible canal hematoma. Disc levels:  Mild cervical spondylosis. Upper chest: Clear lung apices. Other: None. IMPRESSION: 1. No evidence of acute intracranial abnormality. 2. Occipital scalp hematoma. 3. Motion degraded cervical spine CT without evidence of an acute fracture or traumatic malalignment. Electronically Signed   By: Allen  Grady M.D.   On: 02/22/2021 20:21   CT Cervical Spine Wo Contrast  Result Date: 02/22/2021 CLINICAL DATA:  Fall.  Intoxication. EXAM: CT HEAD WITHOUT CONTRAST CT CERVICAL SPINE WITHOUT CONTRAST TECHNIQUE: Multidetector CT imaging of the head and cervical spine was performed following the standard protocol without intravenous contrast. Multiplanar CT image reconstructions of the cervical spine were also generated. COMPARISON:  None. FINDINGS: CT HEAD FINDINGS Brain: There is no evidence of an acute infarct, intracranial hemorrhage, mass, midline shift, or extra-axial fluid collection. There is mild cerebral atrophy. Vascular: No hyperdense vessel. Skull: No fracture or suspicious osseous lesion.  Sinuses/Orbits: Visualized paranasal sinuses and mastoid air cells are clear. Unremarkable orbits. Other: Moderate occipital scalp hematoma primarily to the left of midline. CT CERVICAL SPINE FINDINGS The study is motion degraded including moderate to severe motion artifact through the C2-3 level. Alignment: Reversal of the normal cervical lordosis. No evidence of acute traumatic malalignment. Skull base and vertebrae: No acute fracture is identified, however assessment is limited by motion, particularly for a nondisplaced or minimally displaced fracture in the upper cervical spine. No destructive osseous lesion. Soft tissues and spinal canal: No prevertebral fluid or swelling. No visible canal hematoma. Disc levels:  Mild cervical spondylosis. Upper chest: Clear lung apices. Other: None. IMPRESSION: 1. No evidence of acute intracranial abnormality. 2. Occipital scalp hematoma. 3. Motion degraded cervical spine CT without evidence of an acute fracture or traumatic malalignment. Electronically Signed   By: Allen  Grady M.D.   On: 02/22/2021 20:21   DG Chest Port 1 View  Result Date: 02/22/2021 CLINICAL DATA:  Unwitnessed fall. EXAM: PORTABLE CHEST 1 VIEW COMPARISON:  September 28, 2019 FINDINGS: The heart size and mediastinal contours are within normal limits. Both lungs are clear. A chronic deformity is seen involving the distal right clavicle. IMPRESSION: No active disease.   Electronically Signed   By: Virgina Norfolk M.D.   On: 02/22/2021 20:03   Labs on Admission: I have personally reviewed following labs  CBC: Recent Labs  Lab 02/22/21 1929  WBC 6.6  NEUTROABS 3.8  HGB 11.0*  HCT 29.3*  MCV 89.1  PLT 035   Basic Metabolic Panel: Recent Labs  Lab 02/22/21 2130  NA 118*  K 3.4*  CL 81*  CO2 23  GLUCOSE 87  BUN 16  CREATININE 2.07*  CALCIUM 8.9   GFR: Estimated Creatinine Clearance: 42.1 mL/min (A) (by C-G formula based on SCr of 2.07 mg/dL (H)).  Liver Function Tests: Recent  Labs  Lab 02/22/21 2130  AST 28  ALT 19  ALKPHOS 67  BILITOT 0.5  PROT 7.8  ALBUMIN 3.7   Urine analysis:    Component Value Date/Time   COLORURINE YELLOW 02/22/2021 1929   APPEARANCEUR HAZY (A) 02/22/2021 1929   LABSPEC 1.003 (L) 02/22/2021 1929   PHURINE 6.0 02/22/2021 1929   GLUCOSEU NEGATIVE 02/22/2021 1929   HGBUR LARGE (A) 02/22/2021 1929   BILIRUBINUR NEGATIVE 02/22/2021 1929   KETONESUR NEGATIVE 02/22/2021 1929   PROTEINUR NEGATIVE 02/22/2021 1929   NITRITE NEGATIVE 02/22/2021 1929   LEUKOCYTESUR LARGE (A) 02/22/2021 1929   Nayanna Seaborn N [place] Chagnon D.O. [facility]  If 7PM-7AM, please contact overnight-coverage provider If 7AM-7PM, please contact day coverage provider [email or web address]  02/23/2021, 1:29 AM

## 2021-02-22 NOTE — ED Triage Notes (Signed)
Pt BIB [facility] from home after unwitnessed fall. Pt endorses drinking alcohol tonight and then fell trying to walk to the bathroom. Roommates heard him fall and found him laying on the ground, awake. Pt has slurred speech and decreased coordination w/ walking. Aox4, VSS. EMS gave NS via 18g LAC

## 2021-02-23 DIAGNOSIS — W19XXXA Unspecified fall, initial encounter: Secondary | ICD-10-CM

## 2021-02-23 DIAGNOSIS — I10 Essential (primary) hypertension: Secondary | ICD-10-CM

## 2021-02-23 LAB — BASIC METABOLIC PANEL
Anion gap: 11 (ref 5–15)
Anion gap: 9 (ref 5–15)
Anion gap: 8 (ref 5–15)
BUN: 16 mg/dL (ref 6–20)
BUN: 16 mg/dL (ref 6–20)
BUN: 16 mg/dL (ref 6–20)
CO2: 21 mmol/L — ABNORMAL LOW (ref 22–32)
CO2: 23 mmol/L (ref 22–32)
CO2: 26 mmol/L (ref 22–32)
Calcium: 8.5 mg/dL — ABNORMAL LOW (ref 8.9–10.3)
Calcium: 8.5 mg/dL — ABNORMAL LOW (ref 8.9–10.3)
Calcium: 9 mg/dL (ref 8.9–10.3)
Chloride: 94 mmol/L — ABNORMAL LOW (ref 98–111)
Chloride: 95 mmol/L — ABNORMAL LOW (ref 98–111)
Chloride: 94 mmol/L — ABNORMAL LOW (ref 98–111)
Creatinine, Ser: 1.9 mg/dL — ABNORMAL HIGH (ref 0.61–1.24)
Creatinine, Ser: 2.15 mg/dL — ABNORMAL HIGH (ref 0.61–1.24)
Creatinine, Ser: 2.01 mg/dL — ABNORMAL HIGH (ref 0.61–1.24)
GFR, Estimated: 44 mL/min — ABNORMAL LOW (ref >60)
GFR, Estimated: 38 mL/min — ABNORMAL LOW (ref >60)
GFR, Estimated: 41 mL/min — ABNORMAL LOW (ref >60)
Glucose, Bld: 78 mg/dL (ref 70–99)
Glucose, Bld: 134 mg/dL — ABNORMAL HIGH (ref 70–99)
Glucose, Bld: 131 mg/dL — ABNORMAL HIGH (ref 70–99)
Potassium: 3.4 mmol/L — ABNORMAL LOW (ref 3.5–5.1)
Potassium: 3.5 mmol/L (ref 3.5–5.1)
Potassium: 3.3 mmol/L — ABNORMAL LOW (ref 3.5–5.1)
Sodium: 126 mmol/L — ABNORMAL LOW (ref 135–145)
Sodium: 127 mmol/L — ABNORMAL LOW (ref 135–145)
Sodium: 128 mmol/L — ABNORMAL LOW (ref 135–145)

## 2021-02-23 LAB — CBC
HCT: 24.6 % — ABNORMAL LOW (ref 39.0–52.0)
HCT: 24 % — ABNORMAL LOW (ref 39.0–52.0)
Hemoglobin: 9.2 g/dL — ABNORMAL LOW (ref 13.0–17.0)
Hemoglobin: 8.8 g/dL — ABNORMAL LOW (ref 13.0–17.0)
MCH: 34.3 pg — ABNORMAL HIGH (ref 26.0–34.0)
MCH: 33.8 pg (ref 26.0–34.0)
MCHC: 37.4 g/dL — ABNORMAL HIGH (ref 30.0–36.0)
MCHC: 36.7 g/dL — ABNORMAL HIGH (ref 30.0–36.0)
MCV: 91.8 fL (ref 80.0–100.0)
MCV: 92.3 fL (ref 80.0–100.0)
Platelets: 265 10*3/uL (ref 150–400)
Platelets: 239 10*3/uL (ref 150–400)
RBC: 2.68 MIL/uL — ABNORMAL LOW (ref 4.22–5.81)
RBC: 2.6 MIL/uL — ABNORMAL LOW (ref 4.22–5.81)
RDW: 11.5 % (ref 11.5–15.5)
RDW: 11.6 % (ref 11.5–15.5)
WBC: 6.4 10*3/uL (ref 4.0–10.5)
WBC: 6.1 10*3/uL (ref 4.0–10.5)
nRBC: 0 % (ref 0.0–0.2)
nRBC: 0 % (ref 0.0–0.2)

## 2021-02-23 LAB — CK: Total CK: 242 U/L (ref 49–397)

## 2021-02-23 LAB — RESP PANEL BY RT-PCR (FLU A&B, COVID) ARPGX2
Influenza A by PCR: NEGATIVE
Influenza B by PCR: NEGATIVE
SARS Coronavirus 2 by RT PCR: NEGATIVE

## 2021-02-23 LAB — HIV ANTIBODY (ROUTINE TESTING W REFLEX): HIV Screen 4th Generation wRfx: NONREACTIVE

## 2021-02-23 LAB — PHOSPHORUS: Phosphorus: 3.2 mg/dL (ref 2.5–4.6)

## 2021-02-23 LAB — MAGNESIUM: Magnesium: 1.6 mg/dL — ABNORMAL LOW (ref 1.7–2.4)

## 2021-02-23 LAB — GLUCOSE, CAPILLARY: Glucose-Capillary: 187 mg/dL — ABNORMAL HIGH (ref 70–99)

## 2021-02-23 MED ORDER — HYDRALAZINE HCL 25 MG PO TABS: 25.0000 mg | ORAL_TABLET | Freq: Three times a day (TID) | ORAL | Status: DC | PRN

## 2021-02-23 MED ORDER — LACTATED RINGERS IV BOLUS
1000.0000 mL | Freq: Once | INTRAVENOUS | Status: AC
  Administered 2021-02-23: 1000 mL via INTRAVENOUS

## 2021-02-23 MED ORDER — DESMOPRESSIN ACETATE 4 MCG/ML IJ SOLN
2.0000 ug | Freq: Once | INTRAMUSCULAR | Status: AC
  Administered 2021-02-23: 2 ug via INTRAVENOUS
  Filled 2021-02-23: qty 1

## 2021-02-23 MED ORDER — POTASSIUM CHLORIDE CRYS ER 20 MEQ PO TBCR
40.0000 meq | EXTENDED_RELEASE_TABLET | ORAL | Status: AC
  Administered 2021-02-23 (×2): 40 meq via ORAL
  Filled 2021-02-23 (×2): qty 2

## 2021-02-23 MED ORDER — DESMOPRESSIN ACETATE 4 MCG/ML IJ SOLN
2.0000 ug | Freq: Once | INTRAMUSCULAR | Status: AC
  Administered 2021-02-23: 2 ug via INTRAVENOUS
  Filled 2021-02-23 (×2): qty 1

## 2021-02-23 MED ORDER — DEXTROSE 5 % IV SOLN: INTRAVENOUS | Status: DC

## 2021-02-23 MED ORDER — MIDODRINE HCL 5 MG PO TABS
5.0000 mg | ORAL_TABLET | Freq: Three times a day (TID) | ORAL | Status: DC
  Administered 2021-02-23 (×2): 5 mg via ORAL
  Filled 2021-02-23 (×2): qty 1

## 2021-02-23 MED ORDER — HEPARIN SODIUM (PORCINE) 5000 UNIT/ML IJ SOLN
5000.0000 [IU] | Freq: Three times a day (TID) | INTRAMUSCULAR | Status: DC
  Administered 2021-02-23 – 2021-02-27 (×11): 5000 [IU] via SUBCUTANEOUS
  Filled 2021-02-23 (×11): qty 1

## 2021-02-23 MED ORDER — MAGNESIUM SULFATE 4 GM/100ML IV SOLN
4.0000 g | Freq: Once | INTRAVENOUS | Status: AC
  Administered 2021-02-23: 4 g via INTRAVENOUS
  Filled 2021-02-23: qty 100

## 2021-02-23 NOTE — Progress Notes (Addendum)
PROGRESS NOTE        PATIENT DETAILS Name: Aaron Walker Age: 45 y.o. Sex: male Date of Birth: [birth year] Admit Date: 02/22/2021 Admitting Physician Amy Elmer Ramp, DO WER:[id number], Chales Abrahams, NP  Brief Narrative: Patient is a 45 y.o. 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  Significant events: 5/18>>presenting with weakness/fall-EtOH level of 196, UDS positive for cocaine-sodium 118  Significant studies: 5/18>> UDS: Positive for cocaine 5/18>> EtOH level: 196 5/18>> chest x-ray: No active disease. 5/18>> CT head: No acute intracranial abnormality, orbital scalp hematoma. 5/18>> CT C-spine: No acute fracture/abnormality  Antimicrobial therapy: None  Microbiology data: 5/19>> COVID/influenza PCR: Negative  Procedures : None  Consults: None  DVT Prophylaxis : heparin injection 5,000 Units Start: 02/23/21 1400 Place TED hose Start: 02/22/21 2304    Subjective: Lying comfortably in bed-sleepy but awakes.  BP soft but patient is asymptomatic.   Assessment/Plan: Hyponatremia: Multifactorial-due to beer potomania/HCTZ use.  He does not appear dehydrated on my exam today.  BP is soft but patient is asymptomatic.  Received-bolus of normal saline 2.1 L per EDP when he first came in-subsequently given another liter of Ringer's lactate earlier this morning for hypotension-which I asked RN to stop.  Sodium has rapidly increased to 127 this morning-briefly curbside with nephrology-Dr. Peoples-recommendations are to start D5W and give 1 dose of DDAVP-have discussed with nursing staff.  We will recheck electrolytes later today.  AKI: Likely hemodynamically mediated-continue supportive care-avoid nephrotoxic agents-follow electrolytes.  HTN: BP soft but stable-continue to hold all antihypertensives.  He is asymptomatic.  EtOH use:  Last drink on 5/18-no signs of withdrawal.  On CIWA protocol.  Counseled regarding importance of stopping further alcohol use.  Cocaine use: UDS positive for cocaine-we will counsel over the next few days.   Diet: Diet Order            Diet Heart Room service appropriate? Yes; Fluid consistency: Thin  Diet effective now                  Code Status: Full code   Family Communication: None at bedside  Disposition Plan: Status is: Observation  The patient will require care spanning > 2 midnights and should be moved to inpatient because: Inpatient level of care appropriate due to severity of illness  Dispo: The patient is from: Home              Anticipated d/c is to: Home              Patient currently is not medically stable to d/c.   Difficult to place patient No   Barriers to Discharge: Hyponatremia-needs inpatient monitoring and treatment.  Not yet stable for discharge.  See above.  Antimicrobial agents: Anti-infectives (From admission, onward)   None       Time spent: 35 minutes-Greater than 50% of this time was spent in counseling, explanation of diagnosis, planning of further management, and coordination of care.  MEDICATIONS: Scheduled Meds: . desmopressin  2 mcg Intravenous Once  . folic acid  1 mg Oral Daily  . heparin injection (subcutaneous)  5,000 Units Subcutaneous Q8H  . loratadine  10 mg Oral Daily  . midodrine  5 mg Oral TID WC  . multivitamin with minerals  1 tablet  Oral Daily  . pantoprazole  40 mg Oral BID  . thiamine  100 mg Oral Daily   Or  . thiamine  100 mg Intravenous Daily   Continuous Infusions: . dextrose    . magnesium sulfate bolus IVPB 4 g (02/23/21 0843)   PRN Meds:.acetaminophen **OR** acetaminophen, benzonatate, hydrALAZINE, LORazepam **OR** LORazepam, ondansetron **OR** ondansetron (ZOFRAN) IV   PHYSICAL EXAM: Vital signs: Vitals:   02/23/21 0645 02/23/21 0730 02/23/21 0830 02/23/21 0848  BP: (!) 89/65 (!) 86/57  100/69   Pulse: (!) 110     Resp: 12 17 13    Temp:    99.1 F (37.3 C)  TempSrc:    Oral  SpO2: 100%   100%  Weight:      Height:       Filed Weights   02/22/21 1914  Weight: 72.6 kg   Body mass index is 25.06 kg/m.   Gen Exam:Alert awake-not in any distress HEENT:atraumatic, normocephalic Chest: B/L clear to auscultation anteriorly CVS:S1S2 regular Abdomen:soft non tender, non distended Extremities:no edema Neurology: Non focal Skin: no rash  I have personally reviewed following labs and imaging studies  LABORATORY DATA: CBC: Recent Labs  Lab 02/22/21 1929 02/23/21 0221 02/23/21 0652  WBC 6.6 6.4 6.1  NEUTROABS 3.8  --   --   HGB 11.0* 9.2* 8.8*  HCT 29.3* 24.6* 24.0*  MCV 89.1 91.8 92.3  PLT 255 265 239    Basic Metabolic Panel: Recent Labs  Lab 02/22/21 2130 02/23/21 0221 02/23/21 0652  NA 118* 126* 127*  K 3.4* 3.4* 3.5  CL 81* 94* 95*  CO2 23 21* 23  GLUCOSE 87 78 134*  BUN 16 16 16   CREATININE 2.07* 1.90* 2.15*  CALCIUM 8.9 8.5* 8.5*  MG  --  1.6*  --   PHOS  --  3.2  --     GFR: Estimated Creatinine Clearance: 40.6 mL/min (A) (by C-G formula based on SCr of 2.15 mg/dL (H)).  Liver Function Tests: Recent Labs  Lab 02/22/21 2130  AST 28  ALT 19  ALKPHOS 67  BILITOT 0.5  PROT 7.8  ALBUMIN 3.7   No results for input(s): LIPASE, AMYLASE in the last 168 hours. No results for input(s): AMMONIA in the last 168 hours.  Coagulation Profile: No results for input(s): INR, PROTIME in the last 168 hours.  Cardiac Enzymes: Recent Labs  Lab 02/23/21 0221  CKTOTAL 242    BNP (last 3 results) No results for input(s): PROBNP in the last 8760 hours.  Lipid Profile: No results for input(s): CHOL, HDL, LDLCALC, TRIG, CHOLHDL, LDLDIRECT in the last 72 hours.  Thyroid Function Tests: No results for input(s): TSH, T4TOTAL, FREET4, T3FREE, THYROIDAB in the last 72 hours.  Anemia Panel: No results for input(s): VITAMINB12, FOLATE,  FERRITIN, TIBC, IRON, RETICCTPCT in the last 72 hours.  Urine analysis:    Component Value Date/Time   COLORURINE YELLOW 02/22/2021 1929   APPEARANCEUR HAZY (A) 02/22/2021 1929   LABSPEC 1.003 (L) 02/22/2021 1929   PHURINE 6.0 02/22/2021 1929   GLUCOSEU NEGATIVE 02/22/2021 1929   HGBUR LARGE (A) 02/22/2021 1929   BILIRUBINUR NEGATIVE 02/22/2021 1929   KETONESUR NEGATIVE 02/22/2021 1929   PROTEINUR NEGATIVE 02/22/2021 1929   NITRITE NEGATIVE 02/22/2021 1929   LEUKOCYTESUR LARGE (A) 02/22/2021 1929    Sepsis Labs: Lactic Acid, Venous    Component Value Date/Time   LATICACIDVEN 2.5 (HH) 02/22/2021 2129    MICROBIOLOGY: Recent Results (from the past 240 hour(s))  Resp  Panel by RT-PCR (Flu A&B, Covid) Nasopharyngeal Swab     Status: None   Collection Time: 02/23/21  2:14 AM   Specimen: Nasopharyngeal Swab; Nasopharyngeal(NP) swabs in vial transport medium  Result Value Ref Range Status   SARS Coronavirus 2 by RT PCR NEGATIVE NEGATIVE Final    Comment: (NOTE) SARS-CoV-2 target nucleic acids are NOT DETECTED.  The SARS-CoV-2 RNA is generally detectable in upper respiratory specimens during the acute phase of infection. The lowest concentration of SARS-CoV-2 viral copies this assay can detect is 138 copies/mL. A negative result does not preclude SARS-Cov-2 infection and should not be used as the sole basis for treatment or other patient management decisions. A negative result may occur with  improper specimen collection/handling, submission of specimen other than nasopharyngeal swab, presence of viral mutation(s) within the areas targeted by this assay, and inadequate number of viral copies(<138 copies/mL). A negative result must be combined with clinical observations, patient history, and epidemiological information. The expected result is Negative.  Fact Sheet for Patients:  BloggerCourse.comhttps://www.fda.gov/media/152166/download  Fact Sheet for Healthcare Providers:   SeriousBroker.ithttps://www.fda.gov/media/152162/download  This test is no t yet approved or cleared by the [place] FDA and  has been authorized for detection and/or diagnosis of SARS-CoV-2 by FDA under an Emergency Use Authorization (EUA). This EUA will remain  in effect (meaning this test can be used) for the duration of the COVID-19 declaration under Section 564(b)(1) of the Act, 21 U.S.C.section 360bbb-3(b)(1), unless the authorization is terminated  or revoked sooner.       Influenza A by PCR NEGATIVE NEGATIVE Final   Influenza B by PCR NEGATIVE NEGATIVE Final    Comment: (NOTE) The Xpert Xpress SARS-CoV-2/FLU/RSV plus assay is intended as an aid in the diagnosis of influenza from Nasopharyngeal swab specimens and should not be used as a sole basis for treatment. Nasal washings and aspirates are unacceptable for Xpert Xpress SARS-CoV-2/FLU/RSV testing.  Fact Sheet for Patients: BloggerCourse.comhttps://www.fda.gov/media/152166/download  Fact Sheet for Healthcare Providers: SeriousBroker.ithttps://www.fda.gov/media/152162/download  This test is not yet approved or cleared by the [place] FDA and has been authorized for detection and/or diagnosis of SARS-CoV-2 by FDA under an Emergency Use Authorization (EUA). This EUA will remain in effect (meaning this test can be used) for the duration of the COVID-19 declaration under Section 564(b)(1) of the Act, 21 U.S.C. section 360bbb-3(b)(1), unless the authorization is terminated or revoked.  Performed at [facility], [address] [place], [place] [address]     RADIOLOGY STUDIES/RESULTS: CT Head Wo Contrast  Result Date: 02/22/2021 CLINICAL DATA:  Fall.  Intoxication. EXAM: CT HEAD WITHOUT CONTRAST CT CERVICAL SPINE WITHOUT CONTRAST TECHNIQUE: Multidetector CT imaging of the head and cervical spine was performed following the standard protocol without intravenous contrast. Multiplanar CT image reconstructions of the cervical spine were also generated.  COMPARISON:  None. FINDINGS: CT HEAD FINDINGS Brain: There is no evidence of an acute infarct, intracranial hemorrhage, mass, midline shift, or extra-axial fluid collection. There is mild cerebral atrophy. Vascular: No hyperdense vessel. Skull: No fracture or suspicious osseous lesion. Sinuses/Orbits: Visualized paranasal sinuses and mastoid air cells are clear. Unremarkable orbits. Other: Moderate occipital scalp hematoma primarily to the left of midline. CT CERVICAL SPINE FINDINGS The study is motion degraded including moderate to severe motion artifact through the C2-3 level. Alignment: Reversal of the normal cervical lordosis. No evidence of acute traumatic malalignment. Skull base and vertebrae: No acute fracture is identified, however assessment is limited by motion, particularly for a nondisplaced or  minimally displaced fracture in the upper cervical spine. No destructive osseous lesion. Soft tissues and spinal canal: No prevertebral fluid or swelling. No visible canal hematoma. Disc levels:  Mild cervical spondylosis. Upper chest: Clear lung apices. Other: None. IMPRESSION: 1. No evidence of acute intracranial abnormality. 2. Occipital scalp hematoma. 3. Motion degraded cervical spine CT without evidence of an acute fracture or traumatic malalignment. Electronically Signed   By: Sebastian Ache M.D.   On: 02/22/2021 20:21   CT Cervical Spine Wo Contrast  Result Date: 02/22/2021 CLINICAL DATA:  Fall.  Intoxication. EXAM: CT HEAD WITHOUT CONTRAST CT CERVICAL SPINE WITHOUT CONTRAST TECHNIQUE: Multidetector CT imaging of the head and cervical spine was performed following the standard protocol without intravenous contrast. Multiplanar CT image reconstructions of the cervical spine were also generated. COMPARISON:  None. FINDINGS: CT HEAD FINDINGS Brain: There is no evidence of an acute infarct, intracranial hemorrhage, mass, midline shift, or extra-axial fluid collection. There is mild cerebral atrophy.  Vascular: No hyperdense vessel. Skull: No fracture or suspicious osseous lesion. Sinuses/Orbits: Visualized paranasal sinuses and mastoid air cells are clear. Unremarkable orbits. Other: Moderate occipital scalp hematoma primarily to the left of midline. CT CERVICAL SPINE FINDINGS The study is motion degraded including moderate to severe motion artifact through the C2-3 level. Alignment: Reversal of the normal cervical lordosis. No evidence of acute traumatic malalignment. Skull base and vertebrae: No acute fracture is identified, however assessment is limited by motion, particularly for a nondisplaced or minimally displaced fracture in the upper cervical spine. No destructive osseous lesion. Soft tissues and spinal canal: No prevertebral fluid or swelling. No visible canal hematoma. Disc levels:  Mild cervical spondylosis. Upper chest: Clear lung apices. Other: None. IMPRESSION: 1. No evidence of acute intracranial abnormality. 2. Occipital scalp hematoma. 3. Motion degraded cervical spine CT without evidence of an acute fracture or traumatic malalignment. Electronically Signed   By: Sebastian Ache M.D.   On: 02/22/2021 20:21   DG Chest Port 1 View  Result Date: 02/22/2021 CLINICAL DATA:  Unwitnessed fall. EXAM: PORTABLE CHEST 1 VIEW COMPARISON:  September 28, 2019 FINDINGS: The heart size and mediastinal contours are within normal limits. Both lungs are clear. A chronic deformity is seen involving the distal right clavicle. IMPRESSION: No active disease. Electronically Signed   By: Aram Candela M.D.   On: 02/22/2021 20:03     LOS: 0 days   Jeoffrey Massed, MD  [facility]    To contact the attending provider between 7A-7P or the covering provider during after hours 7P-7A, please log into the web site [email or web address] and access using universal [facility] password for that web site. If you do not have the password, please call the hospital operator.  02/23/2021, 8:56 AM

## 2021-02-23 NOTE — Plan of Care (Signed)
  Problem: Education: Goal: Knowledge of General Education information will improve Description: Including pain rating scale, medication(s)/side effects and non-pharmacologic comfort measures Outcome: Progressing   Problem: Health Behavior/Discharge Planning: Goal: Ability to manage health-related needs will improve Outcome: Progressing   Problem: Clinical Measurements: Goal: Ability to maintain clinical measurements within normal limits will improve Outcome: Progressing Goal: Diagnostic test results will improve Outcome: Progressing Goal: Respiratory complications will improve Outcome: Progressing Goal: Cardiovascular complication will be avoided Outcome: Progressing   Problem: Nutrition: Goal: Adequate nutrition will be maintained Outcome: Progressing   Problem: Coping: Goal: Level of anxiety will decrease Outcome: Progressing   Problem: Elimination: Goal: Will not experience complications related to bowel motility Outcome: Progressing Goal: Will not experience complications related to urinary retention Outcome: Progressing   Problem: Safety: Goal: Ability to remain free from injury will improve Outcome: Progressing

## 2021-02-23 NOTE — ED Notes (Signed)
Dr. Rachael Darby notified pt's BP - 88/48 and last pressure of 97/64. Received new orders.

## 2021-02-23 NOTE — ED Notes (Signed)
Attempted to call nursing report to 5M 

## 2021-02-23 NOTE — Progress Notes (Signed)
NEW ADMISSION NOTE New Admission Note:   Arrival Method: stretcher Mental Orientation: A&O X4 Telemetry: [id number] Assessment: Completed Skin: intact IV: RAC Pain: 0/10 Tubes: none Safety Measures: Safety Fall Prevention Plan has been given, discussed and signed Admission: Completed [facility] Orientation: Patient has been orientated to the room, unit and staff.  Family:  None at bedside  Orders have been reviewed and implemented. Will continue to monitor the patient. Call light has been placed within reach and bed alarm has been activated.   Yarel Kilcrease S Jesiel Garate, RN

## 2021-02-23 NOTE — ED Notes (Signed)
hospitalist at bedside

## 2021-02-23 NOTE — ED Notes (Signed)
Pt spilled urine on chair and floor. Cleaned area with bleach.

## 2021-02-23 NOTE — Progress Notes (Signed)
Repeat sodium this afternoon-128.  Patient completely awake and alert-feels better.  Discussed with nephrology-Dr. Lora Havens will repeat 1 more dose of DDAVP, and decrease D5W to 200 cc an hour.  Repeat chemistry panel at 9:30 PM.

## 2021-02-24 DIAGNOSIS — F1410 Cocaine abuse, uncomplicated: Secondary | ICD-10-CM

## 2021-02-24 LAB — BASIC METABOLIC PANEL
Anion gap: 9 (ref 5–15)
Anion gap: 7 (ref 5–15)
Anion gap: 7 (ref 5–15)
Anion gap: 7 (ref 5–15)
BUN: 17 mg/dL (ref 6–20)
BUN: 13 mg/dL (ref 6–20)
BUN: 14 mg/dL (ref 6–20)
BUN: 13 mg/dL (ref 6–20)
CO2: 24 mmol/L (ref 22–32)
CO2: 23 mmol/L (ref 22–32)
CO2: 22 mmol/L (ref 22–32)
CO2: 21 mmol/L — ABNORMAL LOW (ref 22–32)
Calcium: 8.5 mg/dL — ABNORMAL LOW (ref 8.9–10.3)
Calcium: 8.6 mg/dL — ABNORMAL LOW (ref 8.9–10.3)
Calcium: 8.6 mg/dL — ABNORMAL LOW (ref 8.9–10.3)
Calcium: 8.6 mg/dL — ABNORMAL LOW (ref 8.9–10.3)
Chloride: 93 mmol/L — ABNORMAL LOW (ref 98–111)
Chloride: 93 mmol/L — ABNORMAL LOW (ref 98–111)
Chloride: 96 mmol/L — ABNORMAL LOW (ref 98–111)
Chloride: 96 mmol/L — ABNORMAL LOW (ref 98–111)
Creatinine, Ser: 2.01 mg/dL — ABNORMAL HIGH (ref 0.61–1.24)
Creatinine, Ser: 1.83 mg/dL — ABNORMAL HIGH (ref 0.61–1.24)
Creatinine, Ser: 1.77 mg/dL — ABNORMAL HIGH (ref 0.61–1.24)
Creatinine, Ser: 1.72 mg/dL — ABNORMAL HIGH (ref 0.61–1.24)
GFR, Estimated: 41 mL/min — ABNORMAL LOW (ref >60)
GFR, Estimated: 46 mL/min — ABNORMAL LOW (ref >60)
GFR, Estimated: 48 mL/min — ABNORMAL LOW (ref >60)
GFR, Estimated: 49 mL/min — ABNORMAL LOW (ref >60)
Glucose, Bld: 129 mg/dL — ABNORMAL HIGH (ref 70–99)
Glucose, Bld: 133 mg/dL — ABNORMAL HIGH (ref 70–99)
Glucose, Bld: 108 mg/dL — ABNORMAL HIGH (ref 70–99)
Glucose, Bld: 98 mg/dL (ref 70–99)
Potassium: 3.4 mmol/L — ABNORMAL LOW (ref 3.5–5.1)
Potassium: 3.3 mmol/L — ABNORMAL LOW (ref 3.5–5.1)
Potassium: 4.3 mmol/L (ref 3.5–5.1)
Potassium: 4.6 mmol/L (ref 3.5–5.1)
Sodium: 126 mmol/L — ABNORMAL LOW (ref 135–145)
Sodium: 123 mmol/L — ABNORMAL LOW (ref 135–145)
Sodium: 125 mmol/L — ABNORMAL LOW (ref 135–145)
Sodium: 124 mmol/L — ABNORMAL LOW (ref 135–145)

## 2021-02-24 LAB — COMPREHENSIVE METABOLIC PANEL
ALT: 14 U/L (ref 0–44)
AST: 17 U/L (ref 15–41)
Albumin: 3 g/dL — ABNORMAL LOW (ref 3.5–5.0)
Alkaline Phosphatase: 59 U/L (ref 38–126)
Anion gap: 7 (ref 5–15)
BUN: 14 mg/dL (ref 6–20)
CO2: 23 mmol/L (ref 22–32)
Calcium: 8.7 mg/dL — ABNORMAL LOW (ref 8.9–10.3)
Chloride: 94 mmol/L — ABNORMAL LOW (ref 98–111)
Creatinine, Ser: 1.92 mg/dL — ABNORMAL HIGH (ref 0.61–1.24)
GFR, Estimated: 43 mL/min — ABNORMAL LOW (ref >60)
Glucose, Bld: 120 mg/dL — ABNORMAL HIGH (ref 70–99)
Potassium: 3.5 mmol/L (ref 3.5–5.1)
Sodium: 124 mmol/L — ABNORMAL LOW (ref 135–145)
Total Bilirubin: 0.5 mg/dL (ref 0.3–1.2)
Total Protein: 6.5 g/dL (ref 6.5–8.1)

## 2021-02-24 LAB — MAGNESIUM: Magnesium: 1.8 mg/dL (ref 1.7–2.4)

## 2021-02-24 LAB — CBC
HCT: 23.8 % — ABNORMAL LOW (ref 39.0–52.0)
Hemoglobin: 8.6 g/dL — ABNORMAL LOW (ref 13.0–17.0)
MCH: 33.6 pg (ref 26.0–34.0)
MCHC: 36.1 g/dL — ABNORMAL HIGH (ref 30.0–36.0)
MCV: 93 fL (ref 80.0–100.0)
Platelets: 237 10*3/uL (ref 150–400)
RBC: 2.56 MIL/uL — ABNORMAL LOW (ref 4.22–5.81)
RDW: 11.8 % (ref 11.5–15.5)
WBC: 6.5 10*3/uL (ref 4.0–10.5)
nRBC: 0 % (ref 0.0–0.2)

## 2021-02-24 MED ORDER — MAGNESIUM SULFATE 2 GM/50ML IV SOLN
2.0000 g | Freq: Once | INTRAVENOUS | Status: AC
  Administered 2021-02-24: 2 g via INTRAVENOUS
  Filled 2021-02-24: qty 50

## 2021-02-24 MED ORDER — POTASSIUM CHLORIDE CRYS ER 20 MEQ PO TBCR
40.0000 meq | EXTENDED_RELEASE_TABLET | Freq: Once | ORAL | Status: AC
  Administered 2021-02-24: 40 meq via ORAL
  Filled 2021-02-24: qty 2

## 2021-02-24 MED ORDER — SODIUM CHLORIDE 0.9 % IV SOLN: INTRAVENOUS | Status: AC

## 2021-02-24 MED ORDER — MIDODRINE HCL 5 MG PO TABS
2.5000 mg | ORAL_TABLET | Freq: Three times a day (TID) | ORAL | Status: DC
  Administered 2021-02-24 – 2021-02-26 (×7): 2.5 mg via ORAL
  Filled 2021-02-24 (×7): qty 1

## 2021-02-24 NOTE — Progress Notes (Signed)
PROGRESS NOTE        PATIENT DETAILS Name: Aaron Walker Age: 45 y.o. Sex: male Date of Birth: 1976-03-09 Admit Date: 02/22/2021 Admitting Physician Dewayne Shorter Levora Dredge, MD JWJ:[id number], Chales Abrahams, NP  Brief Narrative: Patient is a 45 y.o. 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  Significant events: 5/18>>presenting with weakness/fall-EtOH level of 196, UDS positive for cocaine-Na 118 5/19>> Na 127 (within less than 12 hours of admission)-DDAVP/D5W started  Significant studies: 5/18>> UDS: Positive for cocaine 5/18>> EtOH level: 196 5/18>> chest x-ray: No active disease. 5/18>> CT head: No acute intracranial abnormality, orbital scalp hematoma. 5/18>> CT C-spine: No acute fracture/abnormality  Antimicrobial therapy: None  Microbiology data: 5/19>> COVID/influenza PCR: Negative  Procedures : None  Consults: None  DVT Prophylaxis : heparin injection 5,000 Units Start: 02/23/21 1400 Place TED hose Start: 02/22/21 2304    Subjective: Lying comfortably in bed-no major issues overnight.   Assessment/Plan: Hyponatremia: Multifactorial-due to beer potomania/HCTZ use.  Patient's sodium levels rapidly increased-as he he received 2.1 L bolus of IV fluids when he first presented in the ED-and subsequently received another 1.0 L of LR on 5/19 a.m. -after discussion with nephrologist-he was given 2 doses of DDAVP and D5W infusion.  He is currently awake/alert-and has a unremarkable neurological exam.  He is euvolemic.  Sodium levels have stabilized-I have restarted 0.9 NS at 50 cc an hour-and plan to recheck electrolytes later today.   AKI: Likely hemodynamically mediated-slowly improving.  Follow electrolytes closely.  Hypokalemia: Due to history of alcohol use-replete and recheck  HTN: BP soft but stable-apparently takes  lisinopril/HCTZ in the outpatient setting.  Started on midodrine yesterday-we will titrate down to 2.5 mg 3 times daily dosing.  He is asymptomatic with the soft blood pressure  Anemia: Suspect hemoglobin on initial presentation was elevated due to hemoconcentration-drop in hemoglobin is probably from IV fluid dilution.  No evidence of blood loss.Plan is to follow CBC closely.  EtOH use: Last drink on 5/18-no signs of withdrawal.  On CIWA protocol.  Counseled regarding importance of stopping further alcohol use.  Cocaine use: UDS positive for cocaine-counseled this morning.   Diet: Diet Order            Diet Heart Room service appropriate? Yes; Fluid consistency: Thin  Diet effective now                  Code Status: Full code   Family Communication: None at bedside  Disposition Plan: Status is: Inpatient  The patient will require care spanning > 2 midnights and should be moved to inpatient because: Inpatient level of care appropriate due to severity of illness  Dispo: The patient is from: Home              Anticipated d/c is to: Home              Patient currently is not medically stable to d/c.   Difficult to place patient No   Barriers to Discharge: Hyponatremia-IVF-not yet stable for discharge.  Antimicrobial agents: Anti-infectives (From admission, onward)   None       Time spent: 35 minutes-Greater than 50% of this time was spent in counseling, explanation of diagnosis, planning of further management, and coordination of care.  MEDICATIONS: Scheduled  Meds: . folic acid  1 mg Oral Daily  . heparin injection (subcutaneous)  5,000 Units Subcutaneous Q8H  . loratadine  10 mg Oral Daily  . midodrine  2.5 mg Oral TID WC  . multivitamin with minerals  1 tablet Oral Daily  . pantoprazole  40 mg Oral BID  . potassium chloride  40 mEq Oral Once  . thiamine  100 mg Oral Daily   Or  . thiamine  100 mg Intravenous Daily   Continuous Infusions: . sodium chloride      PRN Meds:.acetaminophen **OR** acetaminophen, benzonatate, hydrALAZINE, LORazepam **OR** LORazepam, ondansetron **OR** ondansetron (ZOFRAN) IV   PHYSICAL EXAM: Vital signs: Vitals:   02/23/21 1721 02/23/21 2043 02/24/21 0452 02/24/21 0934  BP: 103/60 104/65 106/70 98/66  Pulse: 85 87 88 77  Resp: 19 18 18 18   Temp: 98.3 F (36.8 C) 98.5 F (36.9 C) (!) 97.5 F (36.4 C) (!) 97.5 F (36.4 C)  TempSrc: Oral Oral Oral Oral  SpO2: 100% 100% 100% 100%  Weight:      Height:       Filed Weights   02/22/21 1914  Weight: 72.6 kg   Body mass index is 25.06 kg/m.   Gen Exam:Alert awake-not in any distress HEENT:atraumatic, normocephalic Chest: B/L clear to auscultation anteriorly CVS:S1S2 regular Abdomen:soft non tender, non distended Extremities:no edema Neurology: Non focal Skin: no rash  I have personally reviewed following labs and imaging studies  LABORATORY DATA: CBC: Recent Labs  Lab 02/22/21 1929 02/23/21 0221 02/23/21 0652 02/24/21 0457  WBC 6.6 6.4 6.1 6.5  NEUTROABS 3.8  --   --   --   HGB 11.0* 9.2* 8.8* 8.6*  HCT 29.3* 24.6* 24.0* 23.8*  MCV 89.1 91.8 92.3 93.0  PLT 255 265 239 237    Basic Metabolic Panel: Recent Labs  Lab 02/23/21 0221 02/23/21 0652 02/23/21 1547 02/23/21 2208 02/24/21 0457 02/24/21 0901  NA 126* 127* 128* 126* 124* 123*  K 3.4* 3.5 3.3* 3.4* 3.5 3.3*  CL 94* 95* 94* 93* 94* 93*  CO2 21* 23 26 24 23 23   GLUCOSE 78 134* 131* 129* 120* 133*  BUN 16 16 16 17 14 13   CREATININE 1.90* 2.15* 2.01* 2.01* 1.92* 1.83*  CALCIUM 8.5* 8.5* 9.0 8.5* 8.7* 8.6*  MG 1.6*  --   --   --  1.8  --   PHOS 3.2  --   --   --   --   --     GFR: Estimated Creatinine Clearance: 47.7 mL/min (A) (by C-G formula based on SCr of 1.83 mg/dL (H)).  Liver Function Tests: Recent Labs  Lab 02/22/21 2130 02/24/21 0457  AST 28 17  ALT 19 14  ALKPHOS 67 59  BILITOT 0.5 0.5  PROT 7.8 6.5  ALBUMIN 3.7 3.0*   No results for input(s): LIPASE,  AMYLASE in the last 168 hours. No results for input(s): AMMONIA in the last 168 hours.  Coagulation Profile: No results for input(s): INR, PROTIME in the last 168 hours.  Cardiac Enzymes: Recent Labs  Lab 02/23/21 0221  CKTOTAL 242    BNP (last 3 results) No results for input(s): PROBNP in the last 8760 hours.  Lipid Profile: No results for input(s): CHOL, HDL, LDLCALC, TRIG, CHOLHDL, LDLDIRECT in the last 72 hours.  Thyroid Function Tests: No results for input(s): TSH, T4TOTAL, FREET4, T3FREE, THYROIDAB in the last 72 hours.  Anemia Panel: No results for input(s): VITAMINB12, FOLATE, FERRITIN, TIBC, IRON, RETICCTPCT in the  last 72 hours.  Urine analysis:    Component Value Date/Time   COLORURINE YELLOW 02/22/2021 1929   APPEARANCEUR HAZY (A) 02/22/2021 1929   LABSPEC 1.003 (L) 02/22/2021 1929   PHURINE 6.0 02/22/2021 1929   GLUCOSEU NEGATIVE 02/22/2021 1929   HGBUR LARGE (A) 02/22/2021 1929   BILIRUBINUR NEGATIVE 02/22/2021 1929   KETONESUR NEGATIVE 02/22/2021 1929   PROTEINUR NEGATIVE 02/22/2021 1929   NITRITE NEGATIVE 02/22/2021 1929   LEUKOCYTESUR LARGE (A) 02/22/2021 1929    Sepsis Labs: Lactic Acid, Venous    Component Value Date/Time   LATICACIDVEN 2.5 (HH) 02/22/2021 2129    MICROBIOLOGY: Recent Results (from the past 240 hour(s))  Resp Panel by RT-PCR (Flu A&B, Covid) Nasopharyngeal Swab     Status: None   Collection Time: 02/23/21  2:14 AM   Specimen: Nasopharyngeal Swab; Nasopharyngeal(NP) swabs in vial transport medium  Result Value Ref Range Status   SARS Coronavirus 2 by RT PCR NEGATIVE NEGATIVE Final    Comment: (NOTE) SARS-CoV-2 target nucleic acids are NOT DETECTED.  The SARS-CoV-2 RNA is generally detectable in upper respiratory specimens during the acute phase of infection. The lowest concentration of SARS-CoV-2 viral copies this assay can detect is 138 copies/mL. A negative result does not preclude SARS-Cov-2 infection and should not  be used as the sole basis for treatment or other patient management decisions. A negative result may occur with  improper specimen collection/handling, submission of specimen other than nasopharyngeal swab, presence of viral mutation(s) within the areas targeted by this assay, and inadequate number of viral copies(<138 copies/mL). A negative result must be combined with clinical observations, patient history, and epidemiological information. The expected result is Negative.  Fact Sheet for Patients:  BloggerCourse.com  Fact Sheet for Healthcare Providers:  SeriousBroker.it  This test is no t yet approved or cleared by the [place] FDA and  has been authorized for detection and/or diagnosis of SARS-CoV-2 by FDA under an Emergency Use Authorization (EUA). This EUA will remain  in effect (meaning this test can be used) for the duration of the COVID-19 declaration under Section 564(b)(1) of the Act, 21 U.S.C.section 360bbb-3(b)(1), unless the authorization is terminated  or revoked sooner.       Influenza A by PCR NEGATIVE NEGATIVE Final   Influenza B by PCR NEGATIVE NEGATIVE Final    Comment: (NOTE) The Xpert Xpress SARS-CoV-2/FLU/RSV plus assay is intended as an aid in the diagnosis of influenza from Nasopharyngeal swab specimens and should not be used as a sole basis for treatment. Nasal washings and aspirates are unacceptable for Xpert Xpress SARS-CoV-2/FLU/RSV testing.  Fact Sheet for Patients: BloggerCourse.com  Fact Sheet for Healthcare Providers: SeriousBroker.it  This test is not yet approved or cleared by the [place] FDA and has been authorized for detection and/or diagnosis of SARS-CoV-2 by FDA under an Emergency Use Authorization (EUA). This EUA will remain in effect (meaning this test can be used) for the duration of the COVID-19 declaration under Section  564(b)(1) of the Act, 21 U.S.C. section 360bbb-3(b)(1), unless the authorization is terminated or revoked.  Performed at [facility], [address], [place], [place] [address]     RADIOLOGY STUDIES/RESULTS: CT Head Wo Contrast  Result Date: 02/22/2021 CLINICAL DATA:  Fall.  Intoxication. EXAM: CT HEAD WITHOUT CONTRAST CT CERVICAL SPINE WITHOUT CONTRAST TECHNIQUE: Multidetector CT imaging of the head and cervical spine was performed following the standard protocol without intravenous contrast. Multiplanar CT image reconstructions of the cervical spine were also generated.  COMPARISON:  None. FINDINGS: CT HEAD FINDINGS Brain: There is no evidence of an acute infarct, intracranial hemorrhage, mass, midline shift, or extra-axial fluid collection. There is mild cerebral atrophy. Vascular: No hyperdense vessel. Skull: No fracture or suspicious osseous lesion. Sinuses/Orbits: Visualized paranasal sinuses and mastoid air cells are clear. Unremarkable orbits. Other: Moderate occipital scalp hematoma primarily to the left of midline. CT CERVICAL SPINE FINDINGS The study is motion degraded including moderate to severe motion artifact through the C2-3 level. Alignment: Reversal of the normal cervical lordosis. No evidence of acute traumatic malalignment. Skull base and vertebrae: No acute fracture is identified, however assessment is limited by motion, particularly for a nondisplaced or minimally displaced fracture in the upper cervical spine. No destructive osseous lesion. Soft tissues and spinal canal: No prevertebral fluid or swelling. No visible canal hematoma. Disc levels:  Mild cervical spondylosis. Upper chest: Clear lung apices. Other: None. IMPRESSION: 1. No evidence of acute intracranial abnormality. 2. Occipital scalp hematoma. 3. Motion degraded cervical spine CT without evidence of an acute fracture or traumatic malalignment. Electronically Signed   By: Sebastian AcheAllen  Grady M.D.   On: 02/22/2021 20:21    CT Cervical Spine Wo Contrast  Result Date: 02/22/2021 CLINICAL DATA:  Fall.  Intoxication. EXAM: CT HEAD WITHOUT CONTRAST CT CERVICAL SPINE WITHOUT CONTRAST TECHNIQUE: Multidetector CT imaging of the head and cervical spine was performed following the standard protocol without intravenous contrast. Multiplanar CT image reconstructions of the cervical spine were also generated. COMPARISON:  None. FINDINGS: CT HEAD FINDINGS Brain: There is no evidence of an acute infarct, intracranial hemorrhage, mass, midline shift, or extra-axial fluid collection. There is mild cerebral atrophy. Vascular: No hyperdense vessel. Skull: No fracture or suspicious osseous lesion. Sinuses/Orbits: Visualized paranasal sinuses and mastoid air cells are clear. Unremarkable orbits. Other: Moderate occipital scalp hematoma primarily to the left of midline. CT CERVICAL SPINE FINDINGS The study is motion degraded including moderate to severe motion artifact through the C2-3 level. Alignment: Reversal of the normal cervical lordosis. No evidence of acute traumatic malalignment. Skull base and vertebrae: No acute fracture is identified, however assessment is limited by motion, particularly for a nondisplaced or minimally displaced fracture in the upper cervical spine. No destructive osseous lesion. Soft tissues and spinal canal: No prevertebral fluid or swelling. No visible canal hematoma. Disc levels:  Mild cervical spondylosis. Upper chest: Clear lung apices. Other: None. IMPRESSION: 1. No evidence of acute intracranial abnormality. 2. Occipital scalp hematoma. 3. Motion degraded cervical spine CT without evidence of an acute fracture or traumatic malalignment. Electronically Signed   By: Sebastian AcheAllen  Grady M.D.   On: 02/22/2021 20:21   DG Chest Port 1 View  Result Date: 02/22/2021 CLINICAL DATA:  Unwitnessed fall. EXAM: PORTABLE CHEST 1 VIEW COMPARISON:  September 28, 2019 FINDINGS: The heart size and mediastinal contours are within normal  limits. Both lungs are clear. A chronic deformity is seen involving the distal right clavicle. IMPRESSION: No active disease. Electronically Signed   By: Aram Candelahaddeus  Houston M.D.   On: 02/22/2021 20:03     LOS: 1 day   Jeoffrey MassedShanker Shatima Zalar, MD  [facility]    To contact the attending provider between 7A-7P or the covering provider during after hours 7P-7A, please log into the web site [email or web address] and access using universal [place] password for that web site. If you do not have the password, please call the hospital operator.  02/24/2021, 11:59 AM

## 2021-02-25 LAB — COMPREHENSIVE METABOLIC PANEL
ALT: 13 U/L (ref 0–44)
AST: 18 U/L (ref 15–41)
Albumin: 2.9 g/dL — ABNORMAL LOW (ref 3.5–5.0)
Alkaline Phosphatase: 57 U/L (ref 38–126)
Anion gap: 9 (ref 5–15)
BUN: 13 mg/dL (ref 6–20)
CO2: 20 mmol/L — ABNORMAL LOW (ref 22–32)
Calcium: 8.8 mg/dL — ABNORMAL LOW (ref 8.9–10.3)
Chloride: 97 mmol/L — ABNORMAL LOW (ref 98–111)
Creatinine, Ser: 1.83 mg/dL — ABNORMAL HIGH (ref 0.61–1.24)
GFR, Estimated: 46 mL/min — ABNORMAL LOW (ref >60)
Glucose, Bld: 166 mg/dL — ABNORMAL HIGH (ref 70–99)
Potassium: 4.2 mmol/L (ref 3.5–5.1)
Sodium: 126 mmol/L — ABNORMAL LOW (ref 135–145)
Total Bilirubin: 0.3 mg/dL (ref 0.3–1.2)
Total Protein: 6.5 g/dL (ref 6.5–8.1)

## 2021-02-25 LAB — CBC
HCT: 25.1 % — ABNORMAL LOW (ref 39.0–52.0)
Hemoglobin: 9 g/dL — ABNORMAL LOW (ref 13.0–17.0)
MCH: 33.2 pg (ref 26.0–34.0)
MCHC: 35.9 g/dL (ref 30.0–36.0)
MCV: 92.6 fL (ref 80.0–100.0)
Platelets: 261 10*3/uL (ref 150–400)
RBC: 2.71 MIL/uL — ABNORMAL LOW (ref 4.22–5.81)
RDW: 11.9 % (ref 11.5–15.5)
WBC: 5.9 10*3/uL (ref 4.0–10.5)
nRBC: 0 % (ref 0.0–0.2)

## 2021-02-25 LAB — MAGNESIUM: Magnesium: 1.7 mg/dL (ref 1.7–2.4)

## 2021-02-25 MED ORDER — MAGNESIUM SULFATE 2 GM/50ML IV SOLN
2.0000 g | Freq: Once | INTRAVENOUS | Status: AC
  Administered 2021-02-25: 2 g via INTRAVENOUS
  Filled 2021-02-25: qty 50

## 2021-02-25 MED ORDER — GUAIFENESIN 100 MG/5ML PO SOLN
10.0000 mL | ORAL | Status: DC | PRN
  Administered 2021-02-25 – 2021-02-27 (×2): 200 mg via ORAL
  Filled 2021-02-25 (×2): qty 10

## 2021-02-25 NOTE — Progress Notes (Signed)
PROGRESS NOTE        PATIENT DETAILS Name: Aaron Walker Age: 45 y.o. Sex: male Date of Birth: Feb 11, 1976 Admit Date: 02/22/2021 Admitting Physician Dewayne Shorter Levora Dredge, MD NUU:[id number], Chales Abrahams, NP  Brief Narrative: Patient is a 45 y.o. 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  Significant events: 5/18>>presenting with weakness/fall-EtOH level of 196, UDS positive for cocaine-Na 118 5/19>> Na 127 (within less than 12 hours of admission)-DDAVP/D5W started  Significant studies: 5/18>> UDS: Positive for cocaine 5/18>> EtOH level: 196 5/18>> chest x-ray: No active disease. 5/18>> CT head: No acute intracranial abnormality, orbital scalp hematoma. 5/18>> CT C-spine: No acute fracture/abnormality  Antimicrobial therapy: None  Microbiology data: 5/19>> COVID/influenza PCR: Negative  Procedures : None  Consults: None  DVT Prophylaxis : heparin injection 5,000 Units Start: 02/23/21 1400 Place TED hose Start: 02/22/21 2304    Subjective: Patient seen and examined.  Had used 2 doses of Ativan last 24 hours.  He has some cough.  Wants some cough medicine.  Feels very weak.  He wants to get up and take shower today. We discussed about alcohol cessation and he tells me that he would cut down drinking alcohol. No other overnight events.  Assessment/Plan:  Hyponatremia: Multifactorial-due to beer potomania/HCTZ use.  Patient's sodium levels rapidly increased-as he received 2. L bolus of IV fluids when he first presented in the ED-and subsequently received another 1.0 L of LR on 5/19 a.m. he was given 2 doses of DDAVP and D5W infusion.  He is currently awake/alert-and has an unremarkable neurological exam.  He is euvolemic.  Sodium level is stabilizing.  440-347.  We will discontinue further fluids.  Recheck tomorrow  morning.  AKI: Baseline creatinine 0.96 , 2 years ago.  Creatinine remains about 1.7-1.8 despite hemodynamic stabilization.  Probably new baseline.  We will continue to monitor.  Hypokalemia: Due to history of alcohol use-replete and recheck.  Recheck tomorrow morning.  Replace magnesium aggressively today.  HTN: BP soft but stable-apparently takes lisinopril/HCTZ in the outpatient setting.  Started on midodrine He is asymptomatic with the soft blood pressure.  Can probably go off midodrine by tomorrow.  Anemia: Suspect hemoglobin on initial presentation was elevated due to hemoconcentration-drop in hemoglobin is probably from IV fluid dilution.  No evidence of blood loss.Plan is to follow CBC closely.  EtOH use: Last drink on 5/18.  Mild withdrawal symptoms.  Currently on CIWA protocol.  Counseled.  Cocaine use: UDS positive for cocaine-counseled this morning.  Start mobilizing today.  Diet: Diet Order            Diet Heart Room service appropriate? Yes; Fluid consistency: Thin  Diet effective now                  Code Status: Full code   Family Communication: None at bedside  Disposition Plan: Status is: Inpatient  The patient will require care spanning > 2 midnights and should be moved to inpatient because: Inpatient level of care appropriate due to severity of illness  Dispo: The patient is from: Home              Anticipated d/c is to: Home              Patient currently is not medically  stable to d/c.   Difficult to place patient No   Barriers to Discharge: Not mobilized.  Sodium is still low.  Antimicrobial agents: Anti-infectives (From admission, onward)   None       Time spent: 32 minutes-Greater than 50% of this time was spent in counseling, explanation of diagnosis, planning of further management, and coordination of care.  MEDICATIONS: Scheduled Meds: . folic acid  1 mg Oral Daily  . heparin injection (subcutaneous)  5,000 Units Subcutaneous Q8H   . loratadine  10 mg Oral Daily  . midodrine  2.5 mg Oral TID WC  . multivitamin with minerals  1 tablet Oral Daily  . pantoprazole  40 mg Oral BID  . thiamine  100 mg Oral Daily   Or  . thiamine  100 mg Intravenous Daily   Continuous Infusions:  PRN Meds:.acetaminophen **OR** acetaminophen, benzonatate, guaiFENesin, LORazepam **OR** LORazepam, ondansetron **OR** ondansetron (ZOFRAN) IV   PHYSICAL EXAM: Vital signs: Vitals:   02/24/21 1805 02/24/21 2133 02/25/21 0453 02/25/21 0947  BP: 96/70 101/73 107/78 96/70  Pulse: 88 88 79 89  Resp: 18 18 18 17   Temp: 98.6 F (37 C) 98.6 F (37 C) 98.7 F (37.1 C) 98 F (36.7 C)  TempSrc: Oral Oral Oral   SpO2:  100% 99% 97%  Weight:      Height:       Filed Weights   02/22/21 1914  Weight: 72.6 kg   Body mass index is 25.06 kg/m.   Gen Exam:Alert awake-not in any distress Chronically sick looking.  Not in any distress.  On room air. HEENT:atraumatic, normocephalic Chest: B/L clear to auscultation anteriorly CVS:S1S2 regular Abdomen:soft non tender, non distended Extremities:no edema Neurology: Non focal Skin: no rash  I have personally reviewed following labs and imaging studies  LABORATORY DATA: CBC: Recent Labs  Lab 02/22/21 1929 02/23/21 0221 02/23/21 0652 02/24/21 0457 02/25/21 0311  WBC 6.6 6.4 6.1 6.5 5.9  NEUTROABS 3.8  --   --   --   --   HGB 11.0* 9.2* 8.8* 8.6* 9.0*  HCT 29.3* 24.6* 24.0* 23.8* 25.1*  MCV 89.1 91.8 92.3 93.0 92.6  PLT 255 265 239 237 261    Basic Metabolic Panel: Recent Labs  Lab 02/23/21 0221 02/23/21 0652 02/24/21 0457 02/24/21 0901 02/24/21 1449 02/24/21 2015 02/25/21 0311  NA 126*   < > 124* 123* 125* 124* 126*  K 3.4*   < > 3.5 3.3* 4.3 4.6 4.2  CL 94*   < > 94* 93* 96* 96* 97*  CO2 21*   < > 23 23 22  21* 20*  GLUCOSE 78   < > 120* 133* 108* 98 166*  BUN 16   < > 14 13 14 13 13   CREATININE 1.90*   < > 1.92* 1.83* 1.77* 1.72* 1.83*  CALCIUM 8.5*   < > 8.7* 8.6*  8.6* 8.6* 8.8*  MG 1.6*  --  1.8  --   --   --  1.7  PHOS 3.2  --   --   --   --   --   --    < > = values in this interval not displayed.    GFR: Estimated Creatinine Clearance: 47.7 mL/min (A) (by C-G formula based on SCr of 1.83 mg/dL (H)).  Liver Function Tests: Recent Labs  Lab 02/22/21 2130 02/24/21 0457 02/25/21 0311  AST 28 17 18   ALT 19 14 13   ALKPHOS 67 59 57  BILITOT 0.5 0.5 0.3  PROT 7.8 6.5 6.5  ALBUMIN 3.7 3.0* 2.9*   No results for input(s): LIPASE, AMYLASE in the last 168 hours. No results for input(s): AMMONIA in the last 168 hours.  Coagulation Profile: No results for input(s): INR, PROTIME in the last 168 hours.  Cardiac Enzymes: Recent Labs  Lab 02/23/21 0221  CKTOTAL 242    BNP (last 3 results) No results for input(s): PROBNP in the last 8760 hours.  Lipid Profile: No results for input(s): CHOL, HDL, LDLCALC, TRIG, CHOLHDL, LDLDIRECT in the last 72 hours.  Thyroid Function Tests: No results for input(s): TSH, T4TOTAL, FREET4, T3FREE, THYROIDAB in the last 72 hours.  Anemia Panel: No results for input(s): VITAMINB12, FOLATE, FERRITIN, TIBC, IRON, RETICCTPCT in the last 72 hours.  Urine analysis:    Component Value Date/Time   COLORURINE YELLOW 02/22/2021 1929   APPEARANCEUR HAZY (A) 02/22/2021 1929   LABSPEC 1.003 (L) 02/22/2021 1929   PHURINE 6.0 02/22/2021 1929   GLUCOSEU NEGATIVE 02/22/2021 1929   HGBUR LARGE (A) 02/22/2021 1929   BILIRUBINUR NEGATIVE 02/22/2021 1929   KETONESUR NEGATIVE 02/22/2021 1929   PROTEINUR NEGATIVE 02/22/2021 1929   NITRITE NEGATIVE 02/22/2021 1929   LEUKOCYTESUR LARGE (A) 02/22/2021 1929    Sepsis Labs: Lactic Acid, Venous    Component Value Date/Time   LATICACIDVEN 2.5 (HH) 02/22/2021 2129    MICROBIOLOGY: Recent Results (from the past 240 hour(s))  Resp Panel by RT-PCR (Flu A&B, Covid) Nasopharyngeal Swab     Status: None   Collection Time: 02/23/21  2:14 AM   Specimen: Nasopharyngeal Swab;  Nasopharyngeal(NP) swabs in vial transport medium  Result Value Ref Range Status   SARS Coronavirus 2 by RT PCR NEGATIVE NEGATIVE Final    Comment: (NOTE) SARS-CoV-2 target nucleic acids are NOT DETECTED.  The SARS-CoV-2 RNA is generally detectable in upper respiratory specimens during the acute phase of infection. The lowest concentration of SARS-CoV-2 viral copies this assay can detect is 138 copies/mL. A negative result does not preclude SARS-Cov-2 infection and should not be used as the sole basis for treatment or other patient management decisions. A negative result may occur with  improper specimen collection/handling, submission of specimen other than nasopharyngeal swab, presence of viral mutation(s) within the areas targeted by this assay, and inadequate number of viral copies(<138 copies/mL). A negative result must be combined with clinical observations, patient history, and epidemiological information. The expected result is Negative.  Fact Sheet for Patients:  BloggerCourse.com  Fact Sheet for Healthcare Providers:  SeriousBroker.it  This test is no t yet approved or cleared by the [place] FDA and  has been authorized for detection and/or diagnosis of SARS-CoV-2 by FDA under an Emergency Use Authorization (EUA). This EUA will remain  in effect (meaning this test can be used) for the duration of the COVID-19 declaration under Section 564(b)(1) of the Act, 21 U.S.C.section 360bbb-3(b)(1), unless the authorization is terminated  or revoked sooner.       Influenza A by PCR NEGATIVE NEGATIVE Final   Influenza B by PCR NEGATIVE NEGATIVE Final    Comment: (NOTE) The Xpert Xpress SARS-CoV-2/FLU/RSV plus assay is intended as an aid in the diagnosis of influenza from Nasopharyngeal swab specimens and should not be used as a sole basis for treatment. Nasal washings and aspirates are unacceptable for Xpert Xpress  SARS-CoV-2/FLU/RSV testing.  Fact Sheet for Patients: BloggerCourse.com  Fact Sheet for Healthcare Providers: SeriousBroker.it  This test is not yet approved or cleared by the [place] FDA and has  been authorized for detection and/or diagnosis of SARS-CoV-2 by FDA under an Emergency Use Authorization (EUA). This EUA will remain in effect (meaning this test can be used) for the duration of the COVID-19 declaration under Section 564(b)(1) of the Act, 21 U.S.C. section 360bbb-3(b)(1), unless the authorization is terminated or revoked.  Performed at [facility], [address] [place], [place] [address]     RADIOLOGY STUDIES/RESULTS: No results found.   LOS: 2 days   Dorcas CarrowKuber Janay Canan, MD  [facility]    To contact the attending provider between 7A-7P or the covering provider during after hours 7P-7A, please log into the web site [email or web address] and access using universal [place] password for that web site. If you do not have the password, please call the hospital operator.  02/25/2021, 1:59 PM

## 2021-02-25 NOTE — TOC CAGE-AID Note (Signed)
Transition of Care [facility]) - CAGE-AID Screening   Patient Details  Name: Aaron Walker MRN: [id number] Date of Birth: Jan 12, 1976  [facility] CM/SW Contact:    Emeterio Reeve, [place] Phone Number: 02/25/2021, 1:26 PM   Clinical Narrative:  CSW met with pt at bedside. CSW introduced self and explained her role at the hospital.  Pt was receptive to resources.   CAGE-AID Screening:    Have You Ever Felt You Ought to Cut Down on Your Drinking or Drug Use?: Yes Have People Annoyed You By Critizing Your Drinking Or Drug Use?: No Have You Felt Bad Or Guilty About Your Drinking Or Drug Use?: No Have You Ever Had a Drink or Used Drugs First Thing In The Morning to Steady Your Nerves or to Get Rid of a Hangover?: No CAGE-AID Score: 1  Substance Abuse Education Offered: Yes  Substance abuse interventions: Patient Counseling,Educational Materials   Emeterio Reeve, Latanya Presser, [place] Social Worker [phone]

## 2021-02-25 NOTE — Plan of Care (Signed)
  Problem: Education: Goal: Knowledge of General Education information will improve Description: Including pain rating scale, medication(s)/side effects and non-pharmacologic comfort measures Outcome: Completed/Met   Problem: Health Behavior/Discharge Planning: Goal: Ability to manage health-related needs will improve Outcome: Completed/Met   Problem: Clinical Measurements: Goal: Diagnostic test results will improve Outcome: Completed/Met Goal: Respiratory complications will improve Outcome: Completed/Met Goal: Cardiovascular complication will be avoided Outcome: Completed/Met   Problem: Activity: Goal: Risk for activity intolerance will decrease Outcome: Completed/Met   Problem: Nutrition: Goal: Adequate nutrition will be maintained Outcome: Completed/Met   Problem: Coping: Goal: Level of anxiety will decrease Outcome: Completed/Met   Problem: Elimination: Goal: Will not experience complications related to bowel motility Outcome: Completed/Met Goal: Will not experience complications related to urinary retention Outcome: Completed/Met   Problem: Skin Integrity: Goal: Risk for impaired skin integrity will decrease Outcome: Completed/Met

## 2021-02-25 NOTE — Evaluation (Signed)
Physical Therapy Evaluation Patient Details Name: Aaron Walker MRN: [id number] DOB: July 05, 1976 Today's Date: 02/25/2021   History of Present Illness  The pt is a 45 yo male presenting 5/18 after a fall at home after drinking alcohol, presenting with slurred speech and decreased coordination. Upon work up, pt found to have AKI and electrolyte imbalance (sodium, chloride, potassium). EtOH of 196 at admission wiht UDS positive for cocaine. No acute abnormalities on imaging.  PMH includes: GI bleed, anemia, ETOH use. `    Clinical Impression  Pt in bed upon arrival of PT, agreeable to evaluation at this time. Prior to admission the pt was independent with all mobility without need for AD or assist with any ADLs or IADLs. The pt was able to demo good independence with bed mobility, but did have a few minor LOB with initial stand and OOB mobility, for which he will continue to benefit from skilled PT acutely to address dynamic stability and balance reactions, and will benefit from supervision with OOB mobilit for continued safety. The pt was able to complete hallway ambulation without need for AD and gait speed of 0.8 m/s, and stairs with supervision and use of single rail. The pt will be safe to return home with intermittent assist and supervision from his roommates when cleared to d/c.        Follow Up Recommendations No PT follow up;Supervision for mobility/OOB    Equipment Recommendations  None recommended by PT    Recommendations for Other Services       Precautions / Restrictions Precautions Precautions: Fall Precaution Comments: pt admitted for a fall, reports no other falls Restrictions Weight Bearing Restrictions: No      Mobility  Bed Mobility Overal bed mobility: Independent                  Transfers Overall transfer level: Needs assistance Equipment used: None Transfers: Sit to/from Stand Sit to Stand: Supervision         General transfer comment: mild LOB  with initial stand, pt able to recover without assist, but benefits from close supervision for safety.  Ambulation/Gait Ambulation/Gait assistance: Min guard Gait Distance (Feet): 50 Feet Assistive device: None Gait Pattern/deviations: Step-through pattern Gait velocity: 0.8 m/s Gait velocity interpretation: 1.31 - 2.62 ft/sec, indicative of limited community ambulator General Gait Details: steady with no UE support or assist. fairly normal gait speed, difficulty maintaining with moderate perturbations (heavy door)  Stairs Stairs: Yes Stairs assistance: Supervision Stair Management: One rail Right;Alternating pattern;Forwards Number of Stairs: 11 General stair comments: good stability with UE support on rail         Balance Overall balance assessment: Mild deficits observed, not formally tested                                           Pertinent Vitals/Pain Pain Assessment: No/denies pain    Home Living Family/patient expects to be discharged to:: Private residence Living Arrangements: Non-relatives/Friends Available Help at Discharge: Friend(s);Available 24 hours/day Type of Home: Apartment Home Access: Stairs to enter Entrance Stairs-Rails: Right Entrance Stairs-Number of Steps: 5 Home Layout: One level Home Equipment: Grab bars - tub/shower      Prior Function Level of Independence: Independent         Comments: pt reports full independence, not working     Higher education careers adviser        Extremity/Trunk  Assessment   Upper Extremity Assessment Upper Extremity Assessment: Overall WFL for tasks assessed    Lower Extremity Assessment Lower Extremity Assessment: Overall WFL for tasks assessed (bilateral neuropathy, grossly 4+/5)    Cervical / Trunk Assessment Cervical / Trunk Assessment: Normal  Communication   Communication: No difficulties  Cognition Arousal/Alertness: Awake/alert Behavior During Therapy: Flat affect Overall Cognitive  Status: Within Functional Limits for tasks assessed                                        General Comments General comments (skin integrity, edema, etc.): VSS, HR to 115 following stairs.    Exercises     Assessment/Plan    PT Assessment Patient needs continued PT services  PT Problem List Decreased activity tolerance;Decreased balance;Decreased coordination;Decreased safety awareness       PT Treatment Interventions DME instruction;Gait training;Stair training;Functional mobility training;Therapeutic activities;Therapeutic exercise;Balance training;Patient/family education    PT Goals (Current goals can be found in the Care Plan section)  Acute Rehab PT Goals Patient Stated Goal: return home PT Goal Formulation: With patient Time For Goal Achievement: 03/04/21 Potential to Achieve Goals: Good    Frequency Min 3X/week    AM-PAC PT "6 Clicks" Mobility  Outcome Measure Help needed turning from your back to your side while in a flat bed without using bedrails?: None Help needed moving from lying on your back to sitting on the side of a flat bed without using bedrails?: None Help needed moving to and from a bed to a chair (including a wheelchair)?: A Little Help needed standing up from a chair using your arms (e.g., wheelchair or bedside chair)?: A Little Help needed to walk in hospital room?: A Little Help needed climbing 3-5 steps with a railing? : A Little 6 Click Score: 20    End of Session   Activity Tolerance: Patient tolerated treatment well;Patient limited by fatigue Patient left: in chair;with call bell/phone within reach;with chair alarm set Nurse Communication: Mobility status PT Visit Diagnosis: Other abnormalities of gait and mobility (R26.89)    Time: 6294-7654 PT Time Calculation (min) (ACUTE ONLY): 15 min   Charges:   PT Evaluation $PT Eval Low Complexity: 1 Low          Rolm Baptise, PT, DPT   Acute Rehabilitation Department Pager  #: [phone]  Gaetana Michaelis 02/25/2021, 5:19 PM

## 2021-02-26 LAB — CBC WITH DIFFERENTIAL/PLATELET
Abs Immature Granulocytes: 0.08 10*3/uL — ABNORMAL HIGH (ref 0.00–0.07)
Basophils Absolute: 0.1 10*3/uL (ref 0.0–0.1)
Basophils Relative: 1 %
Eosinophils Absolute: 0.1 10*3/uL (ref 0.0–0.5)
Eosinophils Relative: 1 %
HCT: 26 % — ABNORMAL LOW (ref 39.0–52.0)
Hemoglobin: 9.3 g/dL — ABNORMAL LOW (ref 13.0–17.0)
Immature Granulocytes: 1 %
Lymphocytes Relative: 32 %
Lymphs Abs: 2.8 10*3/uL (ref 0.7–4.0)
MCH: 33.7 pg (ref 26.0–34.0)
MCHC: 35.8 g/dL (ref 30.0–36.0)
MCV: 94.2 fL (ref 80.0–100.0)
Monocytes Absolute: 1.1 10*3/uL — ABNORMAL HIGH (ref 0.1–1.0)
Monocytes Relative: 12 %
Neutro Abs: 4.7 10*3/uL (ref 1.7–7.7)
Neutrophils Relative %: 53 %
Platelets: 290 10*3/uL (ref 150–400)
RBC: 2.76 MIL/uL — ABNORMAL LOW (ref 4.22–5.81)
RDW: 12.1 % (ref 11.5–15.5)
WBC: 8.7 10*3/uL (ref 4.0–10.5)
nRBC: 0 % (ref 0.0–0.2)

## 2021-02-26 LAB — PHOSPHORUS: Phosphorus: 1.6 mg/dL — ABNORMAL LOW (ref 2.5–4.6)

## 2021-02-26 LAB — BASIC METABOLIC PANEL
Anion gap: 6 (ref 5–15)
BUN: 14 mg/dL (ref 6–20)
CO2: 26 mmol/L (ref 22–32)
Calcium: 9.2 mg/dL (ref 8.9–10.3)
Chloride: 100 mmol/L (ref 98–111)
Creatinine, Ser: 1.88 mg/dL — ABNORMAL HIGH (ref 0.61–1.24)
GFR, Estimated: 44 mL/min — ABNORMAL LOW (ref >60)
Glucose, Bld: 98 mg/dL (ref 70–99)
Potassium: 4.3 mmol/L (ref 3.5–5.1)
Sodium: 132 mmol/L — ABNORMAL LOW (ref 135–145)

## 2021-02-26 LAB — MAGNESIUM: Magnesium: 2 mg/dL (ref 1.7–2.4)

## 2021-02-26 MED ORDER — POTASSIUM & SODIUM PHOSPHATES 280-160-250 MG PO PACK
1.0000 | PACK | Freq: Three times a day (TID) | ORAL | Status: DC
  Administered 2021-02-26 – 2021-02-27 (×4): 1 via ORAL
  Filled 2021-02-26 (×5): qty 1

## 2021-02-26 MED ORDER — POTASSIUM PHOSPHATES 15 MMOLE/5ML IV SOLN
20.0000 mmol | Freq: Once | INTRAVENOUS | Status: AC
  Administered 2021-02-26: 20 mmol via INTRAVENOUS
  Filled 2021-02-26: qty 6.67

## 2021-02-26 NOTE — Progress Notes (Signed)
Patient refused ambulation and reported that he is feeling dizzy, RN educated patient and will continue to monitor this patient.

## 2021-02-26 NOTE — Progress Notes (Signed)
PROGRESS NOTE        PATIENT DETAILS Name: Aaron Walker Age: 45 y.o. Sex: male Date of Birth: September 23, 1976 Admit Date: 02/22/2021 Admitting Physician Dewayne Shorter Levora Dredge, MD VOP:FYTWKM, Chales Abrahams, NP  Brief Narrative: Patient is a 45 y.o. 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  Significant events: 5/18>>presenting with weakness/fall-EtOH level of 196, UDS positive for cocaine-Na 118 5/19>> Na 127 (within less than 12 hours of admission)-DDAVP/D5W started  Significant studies: 5/18>> UDS: Positive for cocaine 5/18>> EtOH level: 196 5/18>> chest x-ray: No active disease. 5/18>> CT head: No acute intracranial abnormality, orbital scalp hematoma. 5/18>> CT C-spine: No acute fracture/abnormality  Antimicrobial therapy: None  Microbiology data: 5/19>> COVID/influenza PCR: Negative  Procedures : None  Consults: None  DVT Prophylaxis : heparin injection 5,000 Units Start: 02/23/21 1400 Place TED hose Start: 02/22/21 2304    Subjective: Patient seen and examined.  No overnight events.  Did not have any withdrawal symptoms last 24 hours.  Still feeling dizzy on getting up and walking but he is managing to work with therapies.  Denies any nausea or vomiting.  Was nervous about going home.  Blood pressures are adequate, will discontinue midodrine and monitor.  Assessment/Plan:  Hyponatremia: Multifactorial-due to beer potomania/HCTZ use.  Patient's sodium levels rapidly increased-as he received 2. L bolus of IV fluids when he first presented in the ED-and subsequently received another 1.0 L of LR on 5/19 a.m. he was given 2 doses of DDAVP and D5W infusion.  He is currently awake/alert-and has an unremarkable neurological exam.  He is euvolemic.  Sodium level is stabilizing.  [phone].  Recheck level tomorrow morning to ensure  stabilization.  AKI: Baseline creatinine 0.96 , 2 years ago.  Creatinine remains about 1.7-1.8 despite hemodynamic stabilization.  Probably new baseline.  Electrolytes: Hypokalemia/hypomagnesemia/hypophosphatemia: Replace.  Further replace phosphorus today.  HTN: BP soft but stable-apparently takes lisinopril/HCTZ in the outpatient setting.  Started on midodrine He is asymptomatic with the soft blood pressure.   Discontinue midodrine today and monitor.  Keep off blood pressure medications.  Anemia: Suspect hemoglobin on initial presentation was elevated due to hemoconcentration-drop in hemoglobin is probably from IV fluid dilution.  No evidence of blood loss.Plan is to follow CBC closely.  EtOH use: Last drink on 5/18.  No withdrawal symptoms/24 hours.  Cocaine use: UDS positive for cocaine-counseled this morning.  Continue to mobilize.  Monitor for orthostatic symptoms.  Multiple mobility today with nursing staff.  Anticipate discharge home tomorrow if remains stable.  Diet: Diet Order            Diet Heart Room service appropriate? Yes; Fluid consistency: Thin  Diet effective now                  Code Status: Full code   Family Communication: None at bedside  Disposition Plan: Status is: Inpatient  The patient will require care spanning > 2 midnights and should be moved to inpatient because: Inpatient level of care appropriate due to severity of illness  Dispo: The patient is from: Home              Anticipated d/c is to: Home              Patient currently is not medically  stable to d/c.   Difficult to place patient No   Barriers to Discharge: Still feeling dizzy on mobility.  Antimicrobial agents: Anti-infectives (From admission, onward)   None       Time spent: 32 minutes-Greater than 50% of this time was spent in counseling, explanation of diagnosis, planning of further management, and coordination of care.  MEDICATIONS: Scheduled Meds: . folic acid  1  mg Oral Daily  . heparin injection (subcutaneous)  5,000 Units Subcutaneous Q8H  . loratadine  10 mg Oral Daily  . multivitamin with minerals  1 tablet Oral Daily  . pantoprazole  40 mg Oral BID  . potassium & sodium phosphates  1 packet Oral TID WC & HS  . thiamine  100 mg Oral Daily   Or  . thiamine  100 mg Intravenous Daily   Continuous Infusions: . potassium PHOSPHATE IVPB (in mmol) 20 mmol (02/26/21 0807)   PRN Meds:.benzonatate, guaiFENesin, ondansetron **OR** ondansetron (ZOFRAN) IV   PHYSICAL EXAM: Vital signs: Vitals:   02/25/21 1740 02/25/21 2155 02/26/21 0429 02/26/21 1018  BP: 104/67 114/77 110/81 114/83  Pulse: 93 91 75 73  Resp: 18 18 20 19   Temp: 98.9 F (37.2 C) 98.6 F (37 C) 97.8 F (36.6 C) 98.1 F (36.7 C)  TempSrc: Oral Oral Oral   SpO2: 100% 100% 99% 97%  Weight:      Height:       Filed Weights   02/22/21 1914  Weight: 72.6 kg   Body mass index is 25.06 kg/m.   Gen Exam:Alert awake-not in any distress Chronically sick looking.  Not in any distress.  On room air. HEENT:atraumatic, normocephalic Chest: B/L clear to auscultation anteriorly CVS:S1S2 regular Abdomen:soft non tender, non distended Extremities:no edema Neurology: Non focal Skin: no rash  I have personally reviewed following labs and imaging studies  LABORATORY DATA: CBC: Recent Labs  Lab 02/22/21 1929 02/23/21 0221 02/23/21 0652 02/24/21 0457 02/25/21 0311 02/26/21 0201  WBC 6.6 6.4 6.1 6.5 5.9 8.7  NEUTROABS 3.8  --   --   --   --  4.7  HGB 11.0* 9.2* 8.8* 8.6* 9.0* 9.3*  HCT 29.3* 24.6* 24.0* 23.8* 25.1* 26.0*  MCV 89.1 91.8 92.3 93.0 92.6 94.2  PLT 255 265 239 237 261 290    Basic Metabolic Panel: Recent Labs  Lab 02/23/21 0221 02/23/21 0652 02/24/21 0457 02/24/21 0901 02/24/21 1449 02/24/21 2015 02/25/21 0311 02/26/21 0201  NA 126*   < > 124* 123* 125* 124* 126* 132*  K 3.4*   < > 3.5 3.3* 4.3 4.6 4.2 4.3  CL 94*   < > 94* 93* 96* 96* 97* 100  CO2  21*   < > 23 23 22  21* 20* 26  GLUCOSE 78   < > 120* 133* 108* 98 166* 98  BUN 16   < > 14 13 14 13 13 14   CREATININE 1.90*   < > 1.92* 1.83* 1.77* 1.72* 1.83* 1.88*  CALCIUM 8.5*   < > 8.7* 8.6* 8.6* 8.6* 8.8* 9.2  MG 1.6*  --  1.8  --   --   --  1.7 2.0  PHOS 3.2  --   --   --   --   --   --  1.6*   < > = values in this interval not displayed.    GFR: Estimated Creatinine Clearance: 46.4 mL/min (A) (by C-G formula based on SCr of 1.88 mg/dL (H)).  Liver Function Tests: Recent Labs  Lab 02/22/21 2130 02/24/21 0457 02/25/21 0311  AST 28 17 18   ALT 19 14 13   ALKPHOS 67 59 57  BILITOT 0.5 0.5 0.3  PROT 7.8 6.5 6.5  ALBUMIN 3.7 3.0* 2.9*   No results for input(s): LIPASE, AMYLASE in the last 168 hours. No results for input(s): AMMONIA in the last 168 hours.  Coagulation Profile: No results for input(s): INR, PROTIME in the last 168 hours.  Cardiac Enzymes: Recent Labs  Lab 02/23/21 0221  CKTOTAL 242    BNP (last 3 results) No results for input(s): PROBNP in the last 8760 hours.  Lipid Profile: No results for input(s): CHOL, HDL, LDLCALC, TRIG, CHOLHDL, LDLDIRECT in the last 72 hours.  Thyroid Function Tests: No results for input(s): TSH, T4TOTAL, FREET4, T3FREE, THYROIDAB in the last 72 hours.  Anemia Panel: No results for input(s): VITAMINB12, FOLATE, FERRITIN, TIBC, IRON, RETICCTPCT in the last 72 hours.  Urine analysis:    Component Value Date/Time   COLORURINE YELLOW 02/22/2021 1929   APPEARANCEUR HAZY (A) 02/22/2021 1929   LABSPEC 1.003 (L) 02/22/2021 1929   PHURINE 6.0 02/22/2021 1929   GLUCOSEU NEGATIVE 02/22/2021 1929   HGBUR LARGE (A) 02/22/2021 1929   BILIRUBINUR NEGATIVE 02/22/2021 1929   KETONESUR NEGATIVE 02/22/2021 1929   PROTEINUR NEGATIVE 02/22/2021 1929   NITRITE NEGATIVE 02/22/2021 1929   LEUKOCYTESUR LARGE (A) 02/22/2021 1929    Sepsis Labs: Lactic Acid, Venous    Component Value Date/Time   LATICACIDVEN 2.5 (HH) 02/22/2021 2129     MICROBIOLOGY: Recent Results (from the past 240 hour(s))  Resp Panel by RT-PCR (Flu A&B, Covid) Nasopharyngeal Swab     Status: None   Collection Time: 02/23/21  2:14 AM   Specimen: Nasopharyngeal Swab; Nasopharyngeal(NP) swabs in vial transport medium  Result Value Ref Range Status   SARS Coronavirus 2 by RT PCR NEGATIVE NEGATIVE Final    Comment: (NOTE) SARS-CoV-2 target nucleic acids are NOT DETECTED.  The SARS-CoV-2 RNA is generally detectable in upper respiratory specimens during the acute phase of infection. The lowest concentration of SARS-CoV-2 viral copies this assay can detect is 138 copies/mL. A negative result does not preclude SARS-Cov-2 infection and should not be used as the sole basis for treatment or other patient management decisions. A negative result may occur with  improper specimen collection/handling, submission of specimen other than nasopharyngeal swab, presence of viral mutation(s) within the areas targeted by this assay, and inadequate number of viral copies(<138 copies/mL). A negative result must be combined with clinical observations, patient history, and epidemiological information. The expected result is Negative.  Fact Sheet for Patients:  2130  Fact Sheet for Healthcare Providers:  02/25/21  This test is no t yet approved or cleared by the [employer] FDA and  has been authorized for detection and/or diagnosis of SARS-CoV-2 by FDA under an Emergency Use Authorization (EUA). This EUA will remain  in effect (meaning this test can be used) for the duration of the COVID-19 declaration under Section 564(b)(1) of the Act, 21 U.S.C.section 360bbb-3(b)(1), unless the authorization is terminated  or revoked sooner.       Influenza A by PCR NEGATIVE NEGATIVE Final   Influenza B by PCR NEGATIVE NEGATIVE Final    Comment: (NOTE) The Xpert Xpress SARS-CoV-2/FLU/RSV plus assay is  intended as an aid in the diagnosis of influenza from Nasopharyngeal swab specimens and should not be used as a sole basis for treatment. Nasal washings and aspirates are unacceptable for Xpert Xpress SARS-CoV-2/FLU/RSV testing.  Fact Sheet for Patients: [employer]comhttps://www.fda.gov/media/152166/download  Fact Sheet for Healthcare Providers: SeriousBroker.ithttps://www.fda.gov/media/152162/download  This test is not yet approved or cleared by the [place] FDA and has been authorized for detection and/or diagnosis of SARS-CoV-2 by FDA under an Emergency Use Authorization (EUA). This EUA will remain in effect (meaning this test can be used) for the duration of the COVID-19 declaration under Section 564(b)(1) of the Act, 21 U.S.C. section 360bbb-3(b)(1), unless the authorization is terminated or revoked.  Performed at [facility], [address] [place], [place] [address]     RADIOLOGY STUDIES/RESULTS: No results found.   LOS: 3 days   Dorcas CarrowKuber Anna Beaird, MD  [facility]    To contact the attending provider between 7A-7P or the covering provider during after hours 7P-7A, please log into the web site [email or web address] and access using universal [place] password for that web site. If you do not have the password, please call the hospital operator.  02/26/2021, 1:30 PM

## 2021-02-26 NOTE — Plan of Care (Signed)
  Problem: Safety: Goal: Ability to remain free from injury will improve Outcome: Completed/Met

## 2021-02-27 ENCOUNTER — Other Ambulatory Visit (HOSPITAL_COMMUNITY): Payer: Self-pay

## 2021-02-27 LAB — BASIC METABOLIC PANEL
Anion gap: 10 (ref 5–15)
BUN: 13 mg/dL (ref 6–20)
CO2: 24 mmol/L (ref 22–32)
Calcium: 9.4 mg/dL (ref 8.9–10.3)
Chloride: 101 mmol/L (ref 98–111)
Creatinine, Ser: 2.08 mg/dL — ABNORMAL HIGH (ref 0.61–1.24)
GFR, Estimated: 39 mL/min — ABNORMAL LOW (ref >60)
Glucose, Bld: 110 mg/dL — ABNORMAL HIGH (ref 70–99)
Potassium: 3.7 mmol/L (ref 3.5–5.1)
Sodium: 135 mmol/L (ref 135–145)

## 2021-02-27 MED ORDER — POTASSIUM & SODIUM PHOSPHATES 280-160-250 MG PO PACK
1.0000 | PACK | Freq: Three times a day (TID) | ORAL | 0 refills | Status: AC
  Filled 2021-02-27 (×2): qty 28, 7d supply, fill #0

## 2021-02-27 MED ORDER — LISINOPRIL 10 MG PO TABS
10.0000 mg | ORAL_TABLET | Freq: Every day | ORAL | 11 refills | Status: DC
  Filled 2021-02-27: qty 30, 30d supply, fill #0

## 2021-02-27 MED ORDER — AMLODIPINE BESYLATE 5 MG PO TABS
5.0000 mg | ORAL_TABLET | Freq: Every day | ORAL | 11 refills | Status: AC
  Filled 2021-02-27 (×2): qty 30, 30d supply, fill #0

## 2021-02-27 NOTE — Evaluation (Signed)
Occupational Therapy Evaluation and Discharge Patient Details Name: Aaron Walker MRN: [id number] DOB: 1975/10/14 Today's Date: 02/27/2021    History of Present Illness The pt is a 45 yo male presenting 5/18 after a fall at home after drinking alcohol, presenting with slurred speech and decreased coordination. Upon work up, pt found to have AKI and electrolyte imbalance (sodium, chloride, potassium). EtOH of 196 at admission wiht UDS positive for cocaine. No acute abnormalities on imaging.  PMH includes: GI bleed, anemia, ETOH use. `   Clinical Impression   Pt reports intermittent lightheadedness, but demonstrated ability to manage ADL, including toileting without assistance/LOB. He has been routinely taking himself to the bathroom. No OT needs.     Follow Up Recommendations  No OT follow up    Equipment Recommendations  None recommended by OT    Recommendations for Other Services       Precautions / Restrictions Precautions Precautions: Fall Precaution Comments: pt admitted for a fall, reports no other falls, reports intermittently lightheadedness      Mobility Bed Mobility Overal bed mobility: Independent                  Transfers Overall transfer level: Independent Equipment used: None             General transfer comment: pt returning from bathroom upon arrival, no LOB    Balance                                           ADL either performed or assessed with clinical judgement   ADL Overall ADL's : Modified independent                                       General ADL Comments: Pt has been routinely taking himself to the bathroom.     Vision Baseline Vision/History: No visual deficits       Perception     Praxis      Pertinent Vitals/Pain Pain Assessment: No/denies pain     Hand Dominance Right   Extremity/Trunk Assessment Upper Extremity Assessment Upper Extremity Assessment: Overall WFL for tasks  assessed   Lower Extremity Assessment Lower Extremity Assessment: Overall WFL for tasks assessed   Cervical / Trunk Assessment Cervical / Trunk Assessment: Normal   Communication Communication Communication: No difficulties   Cognition Arousal/Alertness: Awake/alert Behavior During Therapy: Flat affect Overall Cognitive Status: Within Functional Limits for tasks assessed                                     General Comments       Exercises     Shoulder Instructions      Home Living Family/patient expects to be discharged to:: Private residence Living Arrangements: Non-relatives/Friends Available Help at Discharge: Friend(s);Available PRN/intermittently (uncle) Type of Home: Apartment Home Access: Stairs to enter [employer] of Steps: 5 Entrance Stairs-Rails: Right Home Layout: One level     Bathroom Shower/Tub: Chief Strategy Officer: Standard     Home Equipment: Grab bars - tub/shower          Prior Functioning/Environment Level of Independence: Independent        Comments: pt reports full independence, not working  OT Problem List:        OT Treatment/Interventions:      OT Goals(Current goals can be found in the care plan section) Acute Rehab OT Goals Patient Stated Goal: return home  OT Frequency:     Barriers to D/C:            Co-evaluation              AM-PAC OT "6 Clicks" Daily Activity     Outcome Measure Help from another person eating meals?: None Help from another person taking care of personal grooming?: None Help from another person toileting, which includes using toliet, bedpan, or urinal?: None Help from another person bathing (including washing, rinsing, drying)?: None Help from another person to put on and taking off regular upper body clothing?: None Help from another person to put on and taking off regular lower body clothing?: None 6 Click Score: 24   End of Session     Activity Tolerance: Patient tolerated treatment well Patient left: in bed;with call bell/phone within reach  OT Visit Diagnosis: Other abnormalities of gait and mobility (R26.89)                Time: 0175-1025 OT Time Calculation (min): 15 min Charges:  OT General Charges $OT Visit: 1 Visit OT Evaluation $OT Eval Low Complexity: 1 Low  Martie Round, OTR/L Acute Rehabilitation Services Pager: [phone] Office: [phone]  Evern Bio 02/27/2021, 9:06 AM

## 2021-02-27 NOTE — Discharge Summary (Signed)
Physician Discharge Summary  Aaron Walker ONG:[id number] DOB: 1976/09/20 DOA: Aaron Walker PCP: Aaron Walker  Admit date: 02/22/2021 Discharge date: 02/27/2021  Admitted From: Home Disposition: Home  Recommendations for Outpatient Follow-up:  1. Follow up with PCP in 1-2 weeks 2. Please obtain BMP/CBC in one week  Home Health: Not applicable Equipment/Devices: Not applicable  Discharge Condition: Stable CODE STATUS: Full code Diet recommendation: Low-salt diet  Discharge summary: 45 year old gentleman with history of hypertension who is currently on hydrochlorothiazide and lisinopril, ongoing alcohol use and drinks at least 40 ounce bottles of 3-4 beer a day presented to the emergency room with weakness/fall.  He was found with alcohol intoxication and severe hyponatremia with sodium of 118.  Ethyl alcohol 196, UDS positive for cocaine.  Skeletal survey negative for acute injuries except some orbital scalp hematoma.  COVID-19 and influenza negative.  Hyponatremia: Multifactorial.  Probably due to beer [place] mania along with use of hydrochlorothiazide.  Initially treated with isotonic fluid, rapidly corrected and also treated DDAVP and dextrose infusion.  No neurological deficits.  Euvolemic. Sodium level gradually stabilized and normalized now. Extensive counseling to stop drinking beer and alcohol/hydrochlorothiazide discontinued. Potassium magnesium and phosphorus were replaced, will also send additional phosphorus replacement.  Essential hypertension: Mostly hypotensive on arrival.  Treated with midodrine.  Asymptomatic and now blood pressures are improving.  Will discontinue lisinopril hydrochlorothiazide combination that he was taking.  Will prescribe a small dose of amlodipine.  Cocaine use: UDS positive for cocaine.  Counseled.  Agreeable to keep up outpatient follow-up.  Acute kidney injury: Baseline known creatinine 0.96 from 2 years ago.  Creatinine remained 1.7-2  despite volume resuscitation and follow-up.  This is probably his new baseline.  Currently fairly compensated and euvolemic.  Recommend rechecking 1 week.  Also referred to community clinic for follow-up.  Patient is medically stable.  No more orthostatic symptoms.  Mobilized in the hallway.  Stable to discharge.   Discharge Diagnoses:  Active Problems:   Hyponatremia   Alcohol abuse with intoxication [name]   Essential hypertension    Discharge Instructions  Discharge Instructions    Diet - low sodium heart healthy   Complete by: As directed    Discharge instructions   Complete by: As directed    Do not drink any type of alcohol   Increase activity slowly   Complete by: As directed      Allergies as of 02/27/2021   No Known Allergies     Medication List    STOP taking these medications   benzonatate 100 MG capsule Commonly known as: TESSALON   cetirizine 10 MG tablet Commonly known as: ZyrTEC Allergy   doxycycline 100 MG capsule Commonly known as: VIBRAMYCIN   lisinopril-hydrochlorothiazide 20-25 MG tablet Commonly known as: ZESTORETIC   pantoprazole 40 MG tablet Commonly known as: PROTONIX     TAKE these medications   amLODipine 5 MG tablet Commonly known as: NORVASC Take 1 tablet (5 mg total) by mouth daily.   multivitamin with minerals tablet Take 1 tablet by mouth daily.   potassium & sodium phosphates 280-160-250 MG Pack Commonly known as: PHOS-NAK Take 1 packet by mouth 4 (four) times daily -  with meals and at bedtime for 7 days.       Follow-up Information    Schedule an appointment as soon as possible for a visit  with Aaron Walker.   Contact information: [address] [place] [address] [phone]  Schedule an appointment as soon as possible for a visit  with Aaron Walker.   Contact information: [address]  [address] [place] [address] [phone]              No Known  Allergies  Consultations:  None   Procedures/Studies: CT Head Wo Contrast  Result Date: 02/22/2021 CLINICAL DATA:  Fall.  Intoxication. EXAM: CT HEAD WITHOUT CONTRAST CT CERVICAL SPINE WITHOUT CONTRAST TECHNIQUE: Multidetector CT imaging of the head and cervical spine was performed following the standard protocol without intravenous contrast. Multiplanar CT image reconstructions of the cervical spine were also generated. COMPARISON:  None. FINDINGS: CT HEAD FINDINGS Brain: There is no evidence of an acute infarct, intracranial hemorrhage, mass, midline shift, or extra-axial fluid collection. There is mild cerebral atrophy. Vascular: No hyperdense vessel. Skull: No fracture or suspicious osseous lesion. Sinuses/Orbits: Visualized paranasal sinuses and mastoid air cells are clear. Unremarkable orbits. Other: Moderate occipital scalp hematoma primarily to the left of midline. CT CERVICAL SPINE FINDINGS The study is motion degraded including moderate to severe motion artifact through the C2-3 level. Alignment: Reversal of the normal cervical lordosis. No evidence of acute traumatic malalignment. Skull base and vertebrae: No acute fracture is identified, however assessment is limited by motion, particularly for a nondisplaced or minimally displaced fracture in the upper cervical spine. No destructive osseous lesion. Soft tissues and spinal canal: No prevertebral fluid or swelling. No visible canal hematoma. Disc levels:  Mild cervical spondylosis. Upper chest: Clear lung apices. Other: None. IMPRESSION: 1. No evidence of acute intracranial abnormality. 2. Occipital scalp hematoma. 3. Motion degraded cervical spine CT without evidence of an acute fracture or traumatic malalignment. Electronically Signed   By: Aaron Ache M.D.   On: 02/22/2021 20:21   CT Cervical Spine Wo Contrast  Result Date: 02/22/2021 CLINICAL DATA:  Fall.  Intoxication. EXAM: CT HEAD WITHOUT CONTRAST CT CERVICAL SPINE WITHOUT CONTRAST  TECHNIQUE: Multidetector CT imaging of the head and cervical spine was performed following the standard protocol without intravenous contrast. Multiplanar CT image reconstructions of the cervical spine were also generated. COMPARISON:  None. FINDINGS: CT HEAD FINDINGS Brain: There is no evidence of an acute infarct, intracranial hemorrhage, mass, midline shift, or extra-axial fluid collection. There is mild cerebral atrophy. Vascular: No hyperdense vessel. Skull: No fracture or suspicious osseous lesion. Sinuses/Orbits: Visualized paranasal sinuses and mastoid air cells are clear. Unremarkable orbits. Other: Moderate occipital scalp hematoma primarily to the left of midline. CT CERVICAL SPINE FINDINGS The study is motion degraded including moderate to severe motion artifact through the C2-3 level. Alignment: Reversal of the normal cervical lordosis. No evidence of acute traumatic malalignment. Skull base and vertebrae: No acute fracture is identified, however assessment is limited by motion, particularly for a nondisplaced or minimally displaced fracture in the upper cervical spine. No destructive osseous lesion. Soft tissues and spinal canal: No prevertebral fluid or swelling. No visible canal hematoma. Disc levels:  Mild cervical spondylosis. Upper chest: Clear lung apices. Other: None. IMPRESSION: 1. No evidence of acute intracranial abnormality. 2. Occipital scalp hematoma. 3. Motion degraded cervical spine CT without evidence of an acute fracture or traumatic malalignment. Electronically Signed   By: Aaron Ache M.D.   On: 02/22/2021 20:21   DG Chest Port 1 View  Result Date: 02/22/2021 CLINICAL DATA:  Unwitnessed fall. EXAM: PORTABLE CHEST 1 VIEW COMPARISON:  September 28, 2019 FINDINGS: The heart size and mediastinal contours are within normal limits. Both lungs are clear. A chronic deformity is  seen involving the distal right clavicle. IMPRESSION: No active disease. Electronically Signed   By: Aram Candelahaddeus   Houston M.D.   On: 02/22/2021 20:03   (Echo, Carotid, EGD, Colonoscopy, ERCP)    Subjective: Patient seen and examined.  Able to move around.  He had some dizziness yesterday but none today. He feels well to go home.  He lives with family members.   Discharge Exam: Vitals:   02/27/21 0520 02/27/21 1019  BP: (!) 132/91 (!) 104/93  Pulse: 97 (!) 110  Resp: 20 18  Temp: 98.2 F (36.8 C) (!) 97.5 F (36.4 C)  SpO2: 100% 100%   Vitals:   02/26/21 1657 02/26/21 2122 02/27/21 0520 02/27/21 1019  BP: 113/80 111/75 (!) 132/91 (!) 104/93  Pulse: 92 86 97 (!) 110  Resp: 18 20 20 18   Temp: (!) 97.4 F (36.3 C) 98.4 F (36.9 C) 98.2 F (36.8 C) (!) 97.5 F (36.4 C)  TempSrc: Oral Oral Oral Oral  SpO2: 100% 100% 100% 100%  Weight:      Height:        General: Pt is alert, awake, not in acute distress Cardiovascular: RRR, S1/S2 +, no rubs, no gallops Respiratory: CTA bilaterally, no wheezing, no rhonchi Abdominal: Soft, NT, ND, bowel sounds + Extremities: no edema, no cyanosis    The results of significant diagnostics from this hospitalization (including imaging, microbiology, ancillary and laboratory) are listed below for reference.     Microbiology: Recent Results (from the past 240 hour(s))  Resp Panel by RT-PCR (Flu A&B, Covid) Nasopharyngeal Swab     Status: None   Collection Time: 02/23/21  2:14 AM   Specimen: Nasopharyngeal Swab; Nasopharyngeal(Walker) swabs in vial transport medium  Result Value Ref Range Status   SARS Coronavirus 2 by RT PCR NEGATIVE NEGATIVE Final    Comment: (NOTE) SARS-CoV-2 target nucleic acids are NOT DETECTED.  The SARS-CoV-2 RNA is generally detectable in upper respiratory specimens during the acute phase of infection. The lowest concentration of SARS-CoV-2 viral copies this assay can detect is 138 copies/mL. A negative result does not preclude SARS-Cov-2 infection and should not be used as the sole basis for treatment or other patient  management decisions. A negative result may occur with  improper specimen collection/handling, submission of specimen other than nasopharyngeal swab, presence of viral mutation(s) within the areas targeted by this assay, and inadequate number of viral copies(<138 copies/mL). A negative result must be combined with clinical observations, patient history, and epidemiological information. The expected result is Negative.  Fact Sheet for Patients:  BloggerCourse.comhttps://www.fda.gov/media/152166/download  Fact Sheet for Healthcare Providers:  SeriousBroker.ithttps://www.fda.gov/media/152162/download  This test is no t yet approved or cleared by the [place] FDA and  has been authorized for detection and/or diagnosis of SARS-CoV-2 by FDA under an Emergency Use Authorization (EUA). This EUA will remain  in effect (meaning this test can be used) for the duration of the COVID-19 declaration under Section 564(b)(1) of the Act, 21 U.S.C.section 360bbb-3(b)(1), unless the authorization is terminated  or revoked sooner.       Influenza A by PCR NEGATIVE NEGATIVE Final   Influenza B by PCR NEGATIVE NEGATIVE Final    Comment: (NOTE) The Xpert Xpress SARS-CoV-2/FLU/RSV plus assay is intended as an aid in the diagnosis of influenza from Nasopharyngeal swab specimens and should not be used as a sole basis for treatment. Nasal washings and aspirates are unacceptable for Xpert Xpress SARS-CoV-2/FLU/RSV testing.  Fact Sheet for Patients: BloggerCourse.comhttps://www.fda.gov/media/152166/download  Fact Sheet for Healthcare Providers:  SeriousBroker.it  This test is not yet approved or cleared by the [place] and has been authorized for detection and/or diagnosis of SARS-CoV-2 by FDA under an Emergency Use Authorization (EUA). This EUA will remain in effect (meaning this test can be used) for the duration of the COVID-19 declaration under Section 564(b)(1) of the Act, 21 U.S.C. section 360bbb-3(b)(1),  unless the authorization is terminated or revoked.  Performed at [facility], [address], [place], [place] [address]      Labs: BNP (last 3 results) No results for input(s): BNP in the last 8760 hours. Basic Metabolic Panel: Recent Labs  Lab 02/23/21 0221 02/23/21 9024 02/24/21 0457 02/24/21 0901 02/24/21 1449 02/24/21 2015 02/25/21 0311 02/26/21 0201 02/27/21 0702  NA 126*   < > 124*   < > 125* 124* 126* 132* 135  K 3.4*   < > 3.5   < > 4.3 4.6 4.2 4.3 3.7  CL 94*   < > 94*   < > 96* 96* 97* 100 101  CO2 21*   < > 23   < > 22 21* 20* 26 24  GLUCOSE 78   < > 120*   < > 108* 98 166* 98 110*  BUN 16   < > 14   < > 14 13 13 14 13   CREATININE 1.90*   < > 1.92*   < > 1.77* 1.72* 1.83* 1.88* 2.08*  CALCIUM 8.5*   < > 8.7*   < > 8.6* 8.6* 8.8* 9.2 9.4  MG 1.6*  --  1.8  --   --   --  1.7 2.0  --   PHOS 3.2  --   --   --   --   --   --  1.6*  --    < > = values in this interval not displayed.   Liver Function Tests: Recent Labs  Lab 02/22/21 2130 02/24/21 0457 02/25/21 0311  AST 28 17 18   ALT 19 14 13   ALKPHOS 67 59 57  BILITOT 0.5 0.5 0.3  PROT 7.8 6.5 6.5  ALBUMIN 3.7 3.0* 2.9*   No results for input(s): LIPASE, AMYLASE in the last 168 hours. No results for input(s): AMMONIA in the last 168 hours. CBC: Recent Labs  Lab 02/22/21 1929 02/23/21 0221 02/23/21 02/24/21 02/24/21 0457 02/25/21 0311 02/26/21 0201  WBC 6.6 6.4 6.1 6.5 5.9 8.7  NEUTROABS 3.8  --   --   --   --  4.7  HGB 11.0* 9.2* 8.8* 8.6* 9.0* 9.3*  HCT 29.3* 24.6* 24.0* 23.8* 25.1* 26.0*  MCV 89.1 91.8 92.3 93.0 92.6 94.2  PLT 255 265 239 237 261 290   Cardiac Enzymes: Recent Labs  Lab 02/23/21 0221  CKTOTAL 242   BNP: Invalid input(s): POCBNP CBG: Recent Labs  Lab 02/23/21 2043  GLUCAP 187*   D-Dimer No results for input(s): DDIMER in the last 72 hours. Hgb A1c No results for input(s): HGBA1C in the last 72 hours. Lipid Profile No results for input(s): CHOL, HDL,  LDLCALC, TRIG, CHOLHDL, LDLDIRECT in the last 72 hours. Thyroid function studies No results for input(s): TSH, T4TOTAL, T3FREE, THYROIDAB in the last 72 hours.  Invalid input(s): FREET3 Anemia work up No results for input(s): VITAMINB12, FOLATE, FERRITIN, TIBC, IRON, RETICCTPCT in the last 72 hours. Urinalysis    Component Value Date/Time   COLORURINE YELLOW 02/22/2021 1929   APPEARANCEUR HAZY (A) 02/22/2021 1929   LABSPEC 1.003 (L) 02/22/2021 1929  PHURINE 6.0 02/22/2021 1929   GLUCOSEU NEGATIVE 02/22/2021 1929   HGBUR LARGE (A) 02/22/2021 1929   BILIRUBINUR NEGATIVE 02/22/2021 1929   KETONESUR NEGATIVE 02/22/2021 1929   PROTEINUR NEGATIVE 02/22/2021 1929   NITRITE NEGATIVE 02/22/2021 1929   LEUKOCYTESUR LARGE (A) 02/22/2021 1929   Sepsis Labs Invalid input(s): PROCALCITONIN,  WBC,  LACTICIDVEN Microbiology Recent Results (from the past 240 hour(s))  Resp Panel by RT-PCR (Flu A&B, Covid) Nasopharyngeal Swab     Status: None   Collection Time: 02/23/21  2:14 AM   Specimen: Nasopharyngeal Swab; Nasopharyngeal(Walker) swabs in vial transport medium  Result Value Ref Range Status   SARS Coronavirus 2 by RT PCR NEGATIVE NEGATIVE Final    Comment: (NOTE) SARS-CoV-2 target nucleic acids are NOT DETECTED.  The SARS-CoV-2 RNA is generally detectable in upper respiratory specimens during the acute phase of infection. The lowest concentration of SARS-CoV-2 viral copies this assay can detect is 138 copies/mL. A negative result does not preclude SARS-Cov-2 infection and should not be used as the sole basis for treatment or other patient management decisions. A negative result may occur with  improper specimen collection/handling, submission of specimen other than nasopharyngeal swab, presence of viral mutation(s) within the areas targeted by this assay, and inadequate number of viral copies(<138 copies/mL). A negative result must be combined with clinical observations, patient history,  and epidemiological information. The expected result is Negative.  Fact Sheet for Patients:  BloggerCourse.com  Fact Sheet for Healthcare Providers:  SeriousBroker.it  This test is no t yet approved or cleared by the [place] FDA and  has been authorized for detection and/or diagnosis of SARS-CoV-2 by FDA under an Emergency Use Authorization (EUA). This EUA will remain  in effect (meaning this test can be used) for the duration of the COVID-19 declaration under Section 564(b)(1) of the Act, 21 U.S.C.section 360bbb-3(b)(1), unless the authorization is terminated  or revoked sooner.       Influenza A by PCR NEGATIVE NEGATIVE Final   Influenza B by PCR NEGATIVE NEGATIVE Final    Comment: (NOTE) The Xpert Xpress SARS-CoV-2/FLU/RSV plus assay is intended as an aid in the diagnosis of influenza from Nasopharyngeal swab specimens and should not be used as a sole basis for treatment. Nasal washings and aspirates are unacceptable for Xpert Xpress SARS-CoV-2/FLU/RSV testing.  Fact Sheet for Patients: BloggerCourse.com  Fact Sheet for Healthcare Providers: SeriousBroker.it  This test is not yet approved or cleared by the [place] FDA and has been authorized for detection and/or diagnosis of SARS-CoV-2 by FDA under an Emergency Use Authorization (EUA). This EUA will remain in effect (meaning this test can be used) for the duration of the COVID-19 declaration under Section 564(b)(1) of the Act, 21 U.S.C. section 360bbb-3(b)(1), unless the authorization is terminated or revoked.  Performed at [facility], [address], [place], [place] [address]      Time coordinating discharge:  32 minutes  SIGNED:   Dorcas Carrow, MD  [facility] 02/27/2021, 10:39 AM

## 2021-02-27 NOTE — Plan of Care (Signed)
  Problem: Clinical Measurements: Goal: Ability to maintain clinical measurements within normal limits will improve Outcome: Adequate for Discharge   

## 2021-02-27 NOTE — Progress Notes (Signed)
Discharge instructions (including medications) discussed with and copy provided to patient/caregiver 

## 2022-06-13 ENCOUNTER — Ambulatory Visit (HOSPITAL_COMMUNITY)
Admission: EM | Admit: 2022-06-13 | Discharge: 2022-06-13 | Disposition: A | Discharge status: Home / Self Care | Payer: No Payment, Other | Attending: Registered Nurse | Admitting: Registered Nurse

## 2022-06-13 ENCOUNTER — Encounter (HOSPITAL_COMMUNITY): Payer: Self-pay | Admitting: Registered Nurse

## 2022-06-13 DIAGNOSIS — F1020 Alcohol dependence, uncomplicated: Secondary | ICD-10-CM | POA: Diagnosis present

## 2022-06-13 DIAGNOSIS — F1420 Cocaine dependence, uncomplicated: Secondary | ICD-10-CM | POA: Insufficient documentation

## 2022-06-13 DIAGNOSIS — Z789 Other specified health status: Secondary | ICD-10-CM | POA: Diagnosis present

## 2022-06-13 NOTE — Discharge Instructions (Addendum)
   The [employer] is the [employer] 48-month holistic program designed to help people overcome a lifestyle of addictions and return to the workforce investing back into the community.  Helping others overcome addictions has always been a priority for the ministry of the [employer]. Dr. Lendon Ka' personal experience with the alcohol addiction of his father led him to [facility] in 1973 with a hope of reaching the addicted. Today, the [employer] helps restore the lives of dozens of men and women throughout the [place] suffering from alcohol addiction and drug addiction.  The first phase of the [facility] is a six-month period where residents receive in-depth instruction in areas critical to building a solid foundation of lasting success as they overcome addictions, including:  Addiction Education [employer] Discipleship Spiritual Disciplines Healthy Relationships Personal Finance Work Warden/ranger All classes are geared toward the message of hope through [place] and the reshaping of the paradigm of how to handle the pressures and pains of life.  During this second phase of addiction recovery, residents learn skills and trades they can incorporate into future careers, such as:  Employment Counsellor Aid In the words of Lendon Ka: "In the first six months, we teach the residents how to live. In the second six months, we teach them how to make a living."  In this final stage, men and women in the [facility] will continue their sobriety in a supportive environment -- all while establishing a solid employment through apprenticeship in one of the [employer] departments:  Men's Division [employer] Division Encouragement [employer] Stores Day Radio broadcast assistant may choose to attend school or receive vocational training, with financial  assistance provided through the [employer] and the [employer] endowment.  During this time, residents continue to live in one of our men's or women's shelters in the [place] area while they work to obtain permanent employment, save needed start-up funds, seek affordable housing, and continue educational and vocational classes.  Must follow the following: Must stay in shelter for 7 days. 7 months with no outside job No car for 12 months No legal guardian Must attend church on Tuesday, Thursday, and twice on Sunday.  Morning devotion 5 days a week.  [facility]  [address] [place], [place] [address] The [employer] provides food, shelter and other programs and services to the homeless men of [place] through our [employer]. By offering safe shelter, three meals a day, clean clothing, Biblical counseling, financial planning, vocational training, GED/education and employment assistance, we've helped mend the shattered lives of many homeless men since opening in 1974. We have 388 beds available with an additional 88 mats for emergency situations. Prospective Client Check-In Information Must provide a photo I.D. within 30 days of your acceptance into the [employer] program. Help out with chores around the [facility]. No sex offender of any type (pending, charged, registered and/or any other sex related offenses) will be permitted to check in. Must be willing to abide by all rules, regulations, and policies established by the [employer]. The following will be provided - shelter, food, clothing, and biblical counseling. If you or someone you know is in need of assistance at our [facility] shelter in [place], [place], please call [phone] ext. [phone].

## 2022-06-13 NOTE — BH Assessment (Addendum)
Comprehensive Clinical Assessment (CCA) Screening, Triage and Referral Note  06/13/2022 Aaron Walker [id number]  Triage/Screening completed. Patient is Routine. MSE completed by Assunta Found, NP and patient is psych cleared. Disposition Social Worker to assist with placement options.        Chief Complaint:  Chief Complaint  Patient presents with   Addiction Problem    Alcohol/cocaine use disorder and needing help with resources   Visit Diagnosis:  Major Depressive Disorder, Recurrent, Severe Cocaine Use Disorder, Severe Alcohol Use Disorder, Severe  Patient Reported Information How did you hear about [place]? Family/Friend  What Is the Reason for Your Visit/Call Today?     Aaron Walker is a 46 y/o male presenting to the [facility]. Referred by family. His complaint today is "I've been having issues for a long time, alcohol/drug use, health issues, can't keep a job, medical issues, can't go to sleep, some days I don't eat and some days I eat fine, homeless, my nerves are bad, my eyes hurt so it's hard for me to focus, my learning disability bothers me, and I can't maintain employment". Patient started drinking alcohol (beer, wine, liquor), at the age of 46 years old. He reports drinking daily, 20+ years, amount of use varies , and last use was yesterday he reports binge drinking. Also uses crack cocaine, daily up to 3-4 times per day, $40-$90  per use, and last use was 06/10/2022. No hx of seizures. No current SI and HI. Auditory/Visual hallucinations present. Not currently taking mood stabilizers. Participated in substance use programs in [place].No support system.  How Long Has This Been Causing You Problems? > than 6 months  What Do You Feel Would Help You the Most Today? Treatment for Depression or other mood problem; Medication(s); Stress Management; Housing Assistance; Financial Resources; Social Support; Alcohol or Drug Use Treatment   Have You Recently Had Any Thoughts About Hurting  Yourself? No  Are You Planning to Commit Suicide/Harm Yourself At This time? No   Have you Recently Had Thoughts About Hurting Someone [name]? No  Are You Planning to Harm Someone at This Time? No  Explanation: No data recorded  Have You Used Any Alcohol or Drugs in the Past 24 Hours? Yes  How Long Ago Did You Use Drugs or Alcohol? No data recorded What Did You Use and How Much? Patient started drinking alcohol (beer, wine, liquor), at the age of 46 years old. He reports drinking daily, 20+ years, amount of use varies , and last use was yesterday he reports binge drinking. Also uses crack cocaine, daily up to 3-4 times per day, $40-$90  per use, and last use was 06/10/2022   Do You Currently Have a Therapist/Psychiatrist? No data recorded Name of Therapist/Psychiatrist: No data recorded  Have You Been Recently Discharged From Any Office Practice or Programs? No data recorded Explanation of Discharge From Practice/Program: No data recorded   CCA Screening Triage Referral Assessment Type of Contact: Face to Face Telemedicine Service Delivery:  N/A Location of Assessment: [facility] Provider Location:  [facility]  Collateral Involvement: None reported  Does Patient Have a Court Appointed Legal Guardian? No   Patient Determined To Be At Risk for Harm To Self or Others Based on Review of Patient Reported Information or Presenting Complaint? No Are There Guns or Other Weapons in Your Home? No Does Patient Present under Involuntary Commitment? No data recorded IVC Papers Initial File Date: No data recorded  [place] of Residence: [place]  Patient Currently Receiving the  Following Services: No   Determination of Need: Emergent (2 hours)   Options For Referral: Chemical Dependency Intensive Outpatient Therapy (CDIOP); Medication Management; Facility-Based Crisis; Other: Comment (Residential Treatment.)   Discharge Disposition: Per Assunta Found, NP, Disposition Social Worker to assist  with placement options for substance use.     Melynda Ripple, Counselor

## 2022-06-13 NOTE — BH Assessment (Signed)
Triage/Screening completed. Patient is Routine. MSE completed by Assunta Found, NP and patient is psych cleared. Disposition Social Worker to assist with placement options.    Aaron Walker is a 46 y/o male presenting to the [facility]. Referred by family. His complaint today is "I've been having issues for a long time, alcohol/drug use, health issues, can't keep a job, medical issues, can't go to sleep, some days I don't eat and some days I eat fine, homeless, my nerves are bad, my eyes hurt so it's hard for me to focus, my learning disability bothers me, and I can't maintain employment". Patient started drinking alcohol (beer, wine, liquor), at the age of 46 years old. He reports drinking daily, 20+ years, amount of use varies , and last use was yesterday he reports binge drinking. Also uses crack cocaine, daily up to 3-4 times per day, $40-$90  per use, and last use was 06/10/2022. No hx of seizures. No current SI and HI. Auditory/Visual hallucinations present. Not currently taking mood stabilizers. Participated in substance use programs in [place].No support system.

## 2022-06-13 NOTE — BH Assessment (Addendum)
LCSW Progress Note   LCSW assisted with providing information regarding the [facility] at the [facility] as pt requested to assistance into the program.    Hansel Starling, MSW, LCSW [facility] [phone] phone

## 2022-06-13 NOTE — ED Provider Notes (Signed)
Behavioral Health Urgent Care Medical Screening Exam  Patient Name: Aaron Walker MRN: [id number] Date of Evaluation: 06/13/22 Chief Complaint:  Alcohol/cocaine use disorder; and needing help with resources Diagnosis:  Final diagnoses:  Alcohol use disorder, severe, dependence (HCC)  Cocaine use disorder, severe, dependence (HCC)  Needs assistance with community resources    History of Present illness: Aaron Walker is a 46 y.o. male. patient presented to [facility] as a walk in with complaints of alcohol/cocaine use disorder and requesting community, and substance use resources  Aaron Walker, 46 y.o., male patient seen face to face by this provider, consulted with Dr. Nelly Rout; and chart reviewed on 06/13/22.  On evaluation Aaron Walker reports he has a lot of issues "I'm homeless, I have issues with alcohol and cocaine, difficulty holding down a job, and a learning disability that makes it hard for me to get what I need."  Patient states he was sent from [place].  My Mom told me I needed to go somewhere that could help me for my medical and alcohol problems."  Patient states that he drinks alcohol everyday "liquor, wine, beer, what ever I get."  Last use alcohol was yesterday (06/12/22)  States that he uses crack cocaine daily 3-4 times a day ($40-$90 per use).  He also reports he has had auditory/visual hallucinations but not currently.  Reports when he doesn't drink he gets the shakes.  But no alcohol since yesterday and patient has no signs/symptoms of withdrawal at this time.  Patient states that he is homeless but is able to go to his mothers house at times to spend the night.  States other than his mother he has no support.  He states that he has a learning disability but no diagnosis "My learning disability makes it hard for me to hod down a job."  During evaluation Aaron Walker is sitting upright in chair with no noted distress.   He is alert/oriented x 4; calm/cooperative; and mood  congruent with affect.  He is speaking in a clear tone at moderate volume, and normal Walker; with good eye contact.  His thought process is coherent and relevant; There is no indication that he is currently responding to internal/external stimuli or experiencing delusional thought content.  He denies suicidal/self-harm/homicidal ideation, psychosis, and paranoia.    At this time Aaron Walker is educated and verbalizes understanding of mental health resources and other crisis services in the community. He is instructed to call 911 and present to the nearest emergency room should he experience any suicidal/homicidal ideation, auditory/visual/hallucinations, or detrimental worsening of his mental health condition. Resources given for DSS, community services, substance use services, housing, and shelter.  He reports he is interested in [employer] but will need to get his things first.  States that his mother would be able to take him to [facility].  Psychiatric Specialty Exam  Presentation  General Appearance:Appropriate for Environment; Casual; Neat  Eye Contact:Good  Speech:Clear and Coherent; Normal Rate  Speech Volume:Normal  Handedness:Right   Mood and Affect  Mood:Dysphoric  Affect:Congruent   Thought Process  Thought Processes:Coherent; Goal Directed  Descriptions of Associations:Intact  Orientation:Full (Time, Place and Person)  Thought Content:Logical    Hallucinations:None  Ideas of Reference:None  Suicidal Thoughts:No  Homicidal Thoughts:No   Sensorium  Memory:Immediate Good; Recent Good; Remote Fair  Judgment:Intact  Insight:Present   Executive Functions  Concentration:Good  Attention Span:Good  Recall:Good  Fund of Knowledge:Fair  Language:Good   Psychomotor Activity  Psychomotor Activity:Normal  Assets  Assets:Communication Skills; Desire for Improvement; Resilience; Social Support   Sleep  Sleep:Fair  Number of hours: No data  recorded  Nutritional Assessment (For OBS and FBC admissions only) Has the patient had a decrease in food intake/or appetite?: Yes Does the patient have dental problems?: No Does the patient have eating habits or behaviors that may be indicators of an eating disorder including binging or inducing vomiting?: No Has the patient recently lost weight without trying?: 0 Has the patient been eating poorly because of a decreased appetite?: 0 Malnutrition Screening Tool Score: 0    Physical Exam: Physical Exam Vitals and nursing note reviewed. Exam conducted with a chaperone present.  Constitutional:      General: He is not in acute distress.    Appearance: Normal appearance. He is not ill-appearing.  Eyes:     Pupils: Pupils are equal, round, and reactive to light.  Cardiovascular:     Rate and Rhythm: Normal rate.     Comments: History of hypertension.  Hasn't taken medication today Pulmonary:     Effort: Pulmonary effort is normal.  Musculoskeletal:        General: Normal range of motion.     Cervical back: Normal range of motion.  Skin:    General: Skin is warm and dry.  Neurological:     Mental Status: He is alert and oriented to person, place, and time.  Psychiatric:        Attention and Perception: Attention and perception normal. He does not perceive auditory or visual hallucinations.        Mood and Affect: Affect normal. Depressed: dysphoric.        Speech: Speech normal.        Behavior: Behavior normal. Behavior is cooperative.        Thought Content: Thought content normal. Thought content is not paranoid or delusional. Thought content does not include homicidal or suicidal ideation.        Cognition and Memory: Cognition normal.        Judgment: Judgment normal.    Review of Systems  Constitutional: Negative.   HENT: Negative.    Eyes:  Positive for pain. Negative for blurred vision, photophobia and discharge.       Follow up with primary provider related to eye  pain, Medical urgent care or ED if worsens.  If have ophthalmologist follow up there  Respiratory: Negative.    Cardiovascular: Negative.   Gastrointestinal: Negative.   Genitourinary: Negative.   Musculoskeletal:  Positive for myalgias.  Skin: Negative.   Neurological: Negative.   Endo/Heme/Allergies: Negative.   Psychiatric/Behavioral:  Positive for depression (related to inability to access resources needed) and substance abuse (Alcohol and cocaine). Hallucinations: Denies at this time but states he has had auditory hallucinations before.Nervous/anxious: Stable. Insomnia: States he hasn't been sleeping well last couple of nights.        Reports he was told to come her by his mother to get help with resources    Blood pressure (!) 133/97, pulse 65, temperature 98.1 F (36.7 C), temperature source Oral, resp. rate 18, SpO2 100 %. There is no height or weight on file to calculate BMI.  Musculoskeletal: Strength & Muscle Tone: within normal limits Gait & Station: normal Patient leans: N/A   [facility] MSE Discharge Disposition for Follow up and Recommendations: Based on my evaluation the patient does not appear to have an emergency medical condition and can be discharged with resources and follow up care in outpatient  services for Medication Management, Substance Abuse Intensive Outpatient Program, and Follow up with Primary care doctor related to elevated blood pressure and eye pain or go to ED if worsening    Discharge Instructions        The [facility] is the [facility]'s 87-month holistic program designed to help people overcome a lifestyle of addictions and return to the workforce investing back into the community.  Helping others overcome addictions has always been a priority for the ministry of the [employer]. Dr. Lendon Ka' personal experience with the alcohol addiction of his father led him to [facility] in 1973 with a hope of reaching the addicted. Today,  the [employer] helps restore the lives of dozens of men and women throughout the [place] suffering from alcohol addiction and drug addiction.  The first phase of the [facility] is a six-month period where residents receive in-depth instruction in areas critical to building a solid foundation of lasting success as they overcome addictions, including:  Addiction Education [employer] Discipleship Spiritual Disciplines Healthy Relationships Personal Finance Work Warden/ranger All classes are geared toward the message of hope through [place] and the reshaping of the paradigm of how to handle the pressures and pains of life.  During this second phase of addiction recovery, residents learn skills and trades they can incorporate into future careers, such as:  Employment Counsellor Aid In the words of Lendon Ka: "In the first six months, we teach the residents how to live. In the second six months, we teach them how to make a living."  In this final stage, men and women in the [facility] will continue their sobriety in a supportive environment -- all while establishing a solid employment through apprenticeship in one of the [employer] departments:  Men's Division [employer] Division Encouragement [employer]rift Stores Day Radio broadcast assistant may choose to attend school or receive vocational training, with financial assistance provided through the [employer] and the [employer] endowment.  During this time, residents continue to live in one of our men's or women's shelters in the [place] area while they work to obtain permanent employment, save needed start-up funds, seek affordable housing, and continue educational and vocational classes.  Must follow the following: Must stay in shelter for 7 days. 7 months with no outside job No  car for 12 months No legal guardian Must attend church on Tuesday, Thursday, and twice on Sunday.  Morning devotion 5 days a week.  [facility]EN[facility] DIV[facility]  [address] [place], [place] [address] The [employer] provides food, shelter and other programs and services to the homeless men of [place] through our [employer]. By offering safe shelter, three meals a day, clean clothing, Biblical counseling, financial planning, vocational training, GED/education and employment assistance, we've helped mend the shattered lives of many homeless men since opening in 1974. We have 388 beds available with an additional 88 mats for emergency situations. Prospective Client Check-In Information Must provide a photo I.D. within 30 days of your acceptance into the [facility] program. Help out with chores around the Mission. No sex offender of any type (pending, charged, registered and/or any other sex related offenses) will be permitted to check in. Must be willing to abide by all rules, regulations, and policies established by the [employer]. The following will be provided - shelter, food, clothing, and biblical counseling. If  you or someone you know is in need of assistance at our [facility] shelter in [place], [place], please call [phone] ext. [phone].      Finnigan Warriner, NP 06/13/2022, 10:21 AM

## 2022-07-22 IMAGING — CT CT HEAD W/O CM
4 series · 16 of 47 positions shown, 18 images · non-contrast
Comparison: None.

CLINICAL DATA: Fall.  Intoxication.

EXAM:
CT HEAD WITHOUT CONTRAST
CT CERVICAL SPINE WITHOUT CONTRAST
TECHNIQUE: Multidetector CT imaging of the head and cervical spine was
performed following the standard protocol without intravenous
contrast. Multiplanar CT image reconstructions of the cervical spine
were also generated.

[Series 3: head without · axial · non-contrast · 0.48mm/px · z∈[-73,+57]mm · 7 of 36 slices shown, 9 images]
[im 5/36  brain]
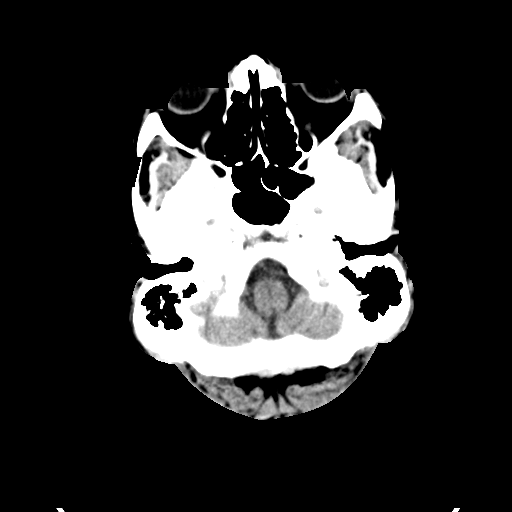
[im 5/36  bone]
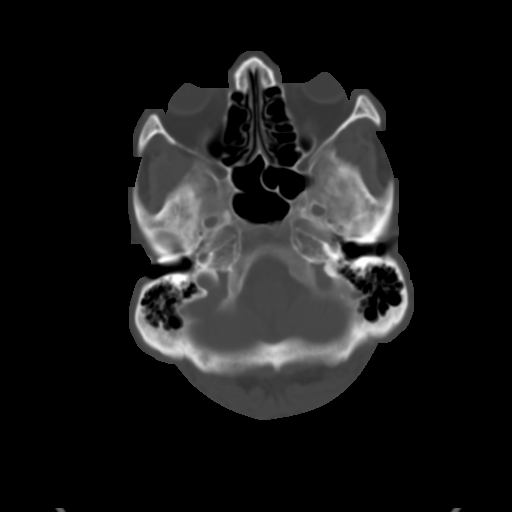
[im 9/36  brain]
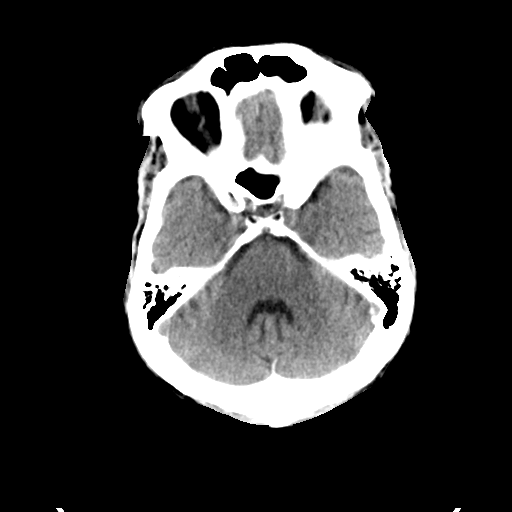
[im 14/36  brain]
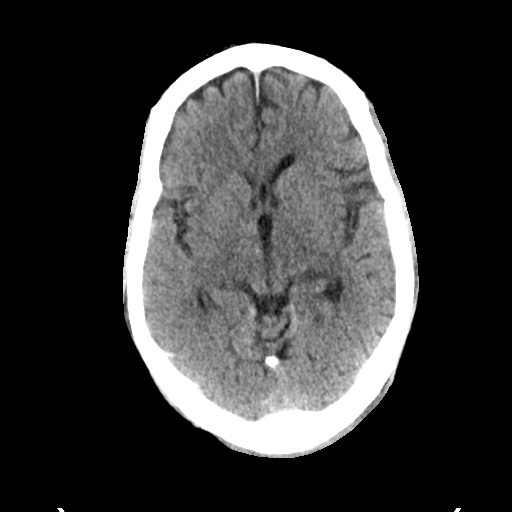
[im 18/36  brain]
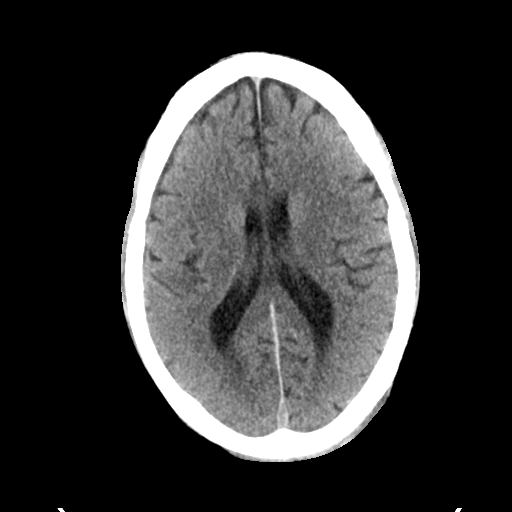
[im 22/36  brain]
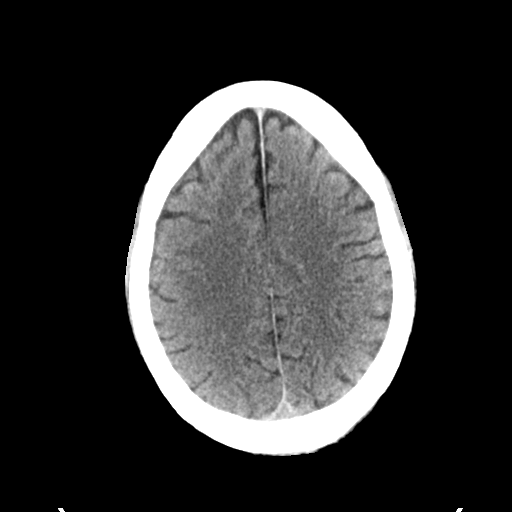
[im 22/36  bone]
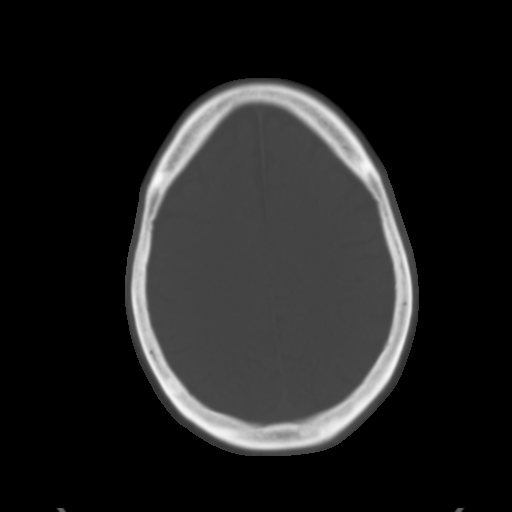
[im 27/36  brain]
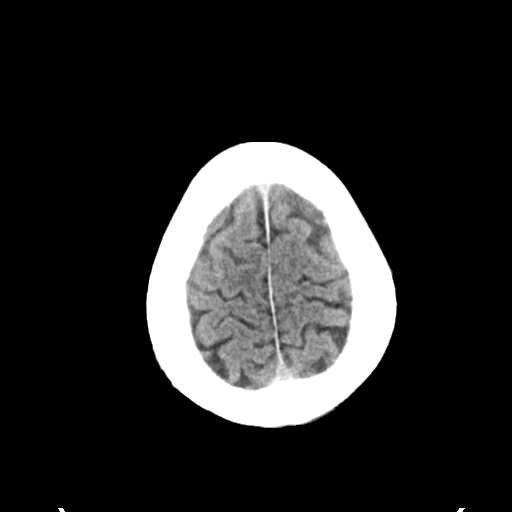
[im 31/36  brain]
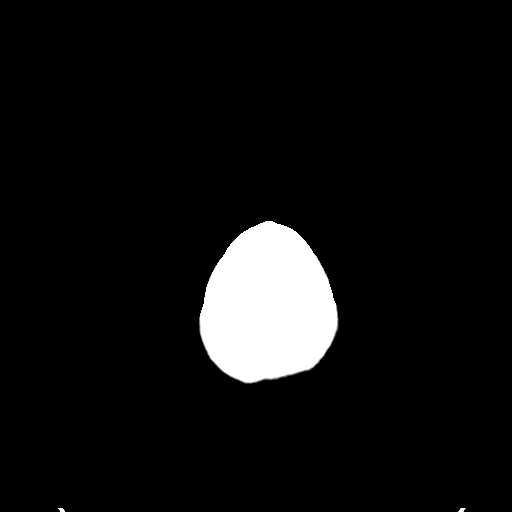

[Series 4: head bone · axial · 0.48mm/px · z∈[-77,-41]mm · 3 of 90 slices shown]
[im 9/90  bone]
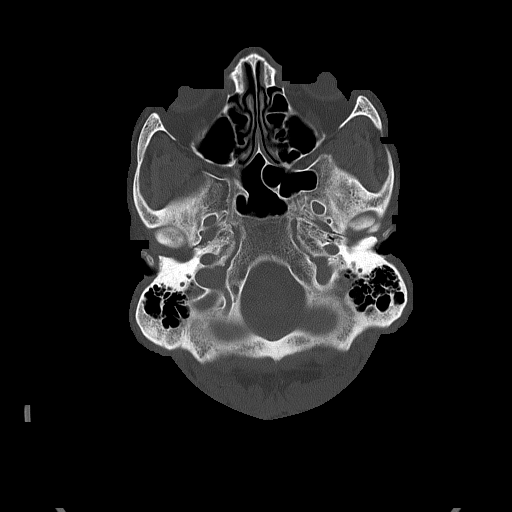
[im 18/90  bone]
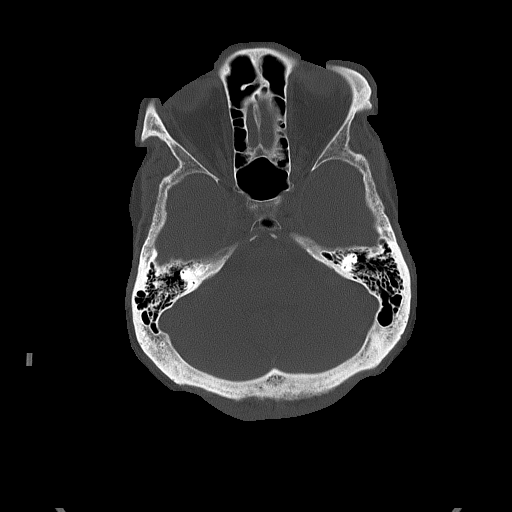
[im 27/90  bone]
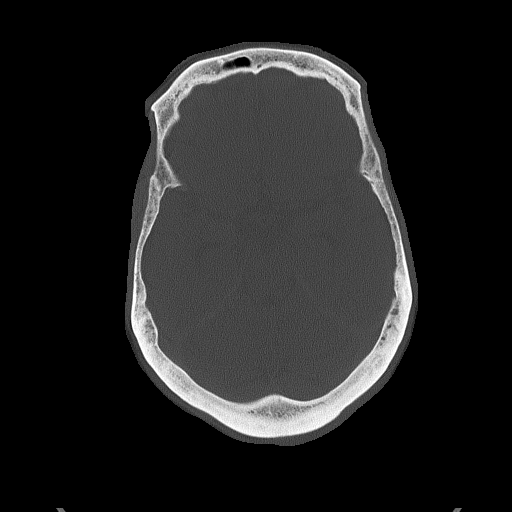

[Series 5: head without cor · coronal · non-contrast · 0.35mm/px · 3 of 76 slices shown]
[im 26/76  brain]
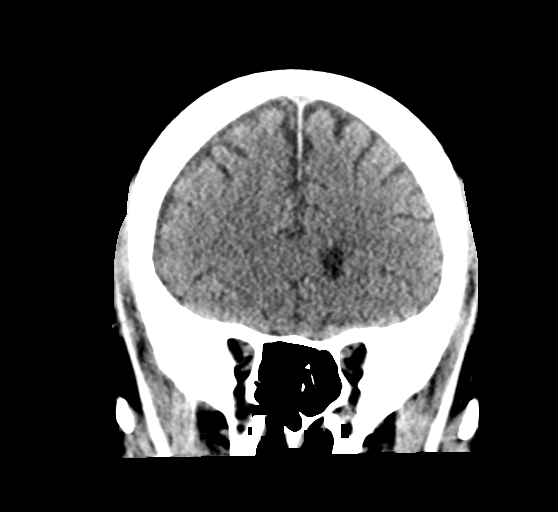
[im 34/76  brain]
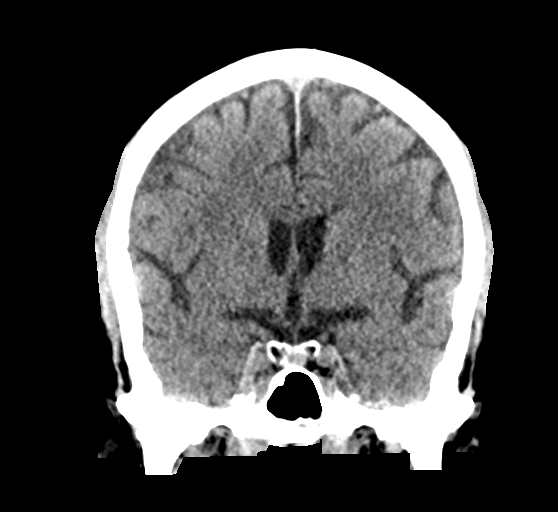
[im 42/76  brain]
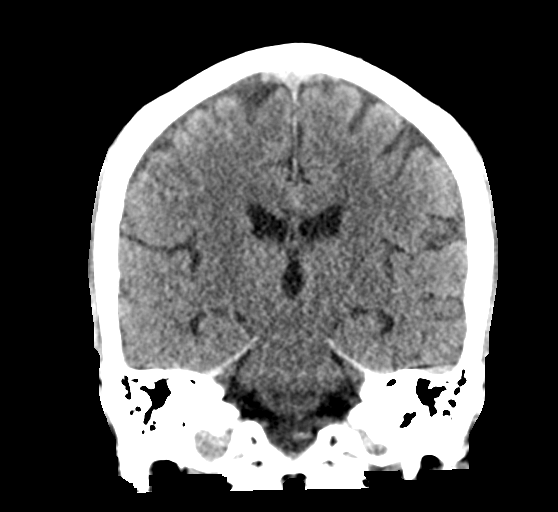

[Series 6: head without sag · sagittal · non-contrast · 0.35mm/px · 3 of 60 slices shown]
[im 20/60  brain]
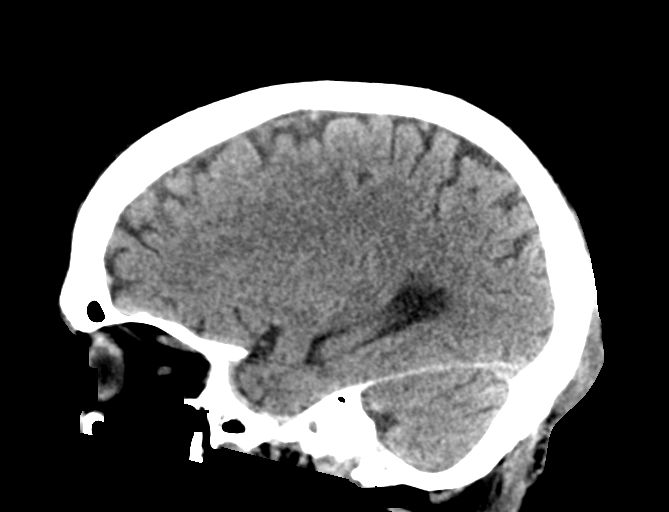
[im 30/60  brain]
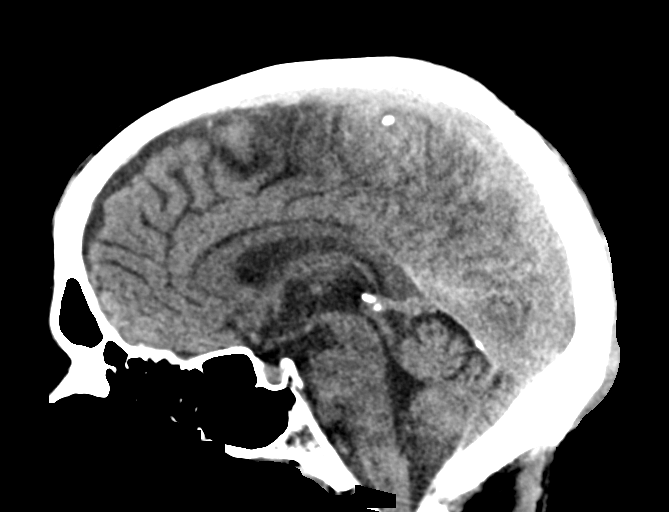
[im 40/60  brain]
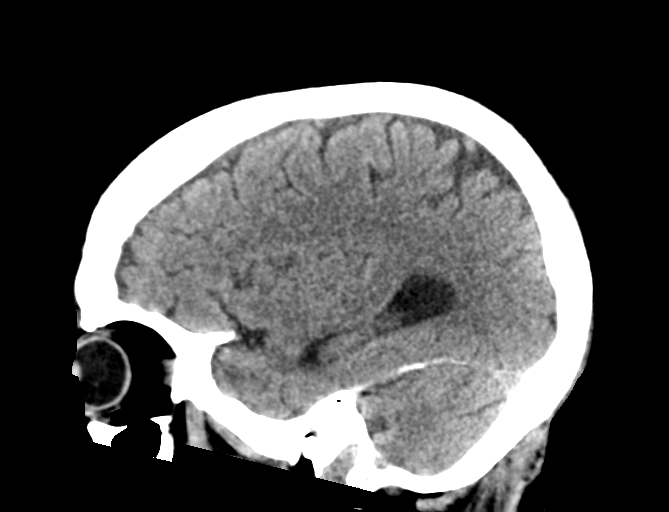

[16 of 47 positions shown; findings below may reference images not displayed]

FINDINGS: CT HEAD FINDINGS

Brain: There is no evidence of an acute infarct, intracranial
hemorrhage, mass, midline shift, or extra-axial fluid collection.
There is mild cerebral atrophy.

Vascular: No hyperdense vessel.

Skull: No fracture or suspicious osseous lesion.

Sinuses/Orbits: Visualized paranasal sinuses and mastoid air cells
are clear. Unremarkable orbits.

Other: Moderate occipital scalp hematoma primarily to the left of
midline.

CT CERVICAL SPINE FINDINGS

The study is motion degraded including moderate to severe motion
artifact through the C2-3 level.

Alignment: Reversal of the normal cervical lordosis. No evidence of
acute traumatic malalignment.

Skull base and vertebrae: No acute fracture is identified, however
assessment is limited by motion, particularly for a nondisplaced or
minimally displaced fracture in the upper cervical spine. No
destructive osseous lesion.

Soft tissues and spinal canal: No prevertebral fluid or swelling. No
visible canal hematoma.

Disc levels:  Mild cervical spondylosis.

Upper chest: Clear lung apices.

Other: None.
IMPRESSION: 1. No evidence of acute intracranial abnormality.
2. Occipital scalp hematoma.
3. Motion degraded cervical spine CT without evidence of an acute
fracture or traumatic malalignment.

## 2022-07-22 IMAGING — CT CT CERVICAL SPINE W/O CM
3 of 4 series · 12 of 33 positions shown, 14 images · non-contrast
Comparison: None.

CLINICAL DATA: Fall.  Intoxication.

EXAM:
CT HEAD WITHOUT CONTRAST
CT CERVICAL SPINE WITHOUT CONTRAST
TECHNIQUE: Multidetector CT imaging of the head and cervical spine was
performed following the standard protocol without intravenous
contrast. Multiplanar CT image reconstructions of the cervical spine
were also generated.

[Series 4: c_spine 2.0 st · axial · 0.41mm/px · z∈[-269,-123]mm · 4 of 111 slices shown, 5 images]
[im 19/111  soft-tissue]
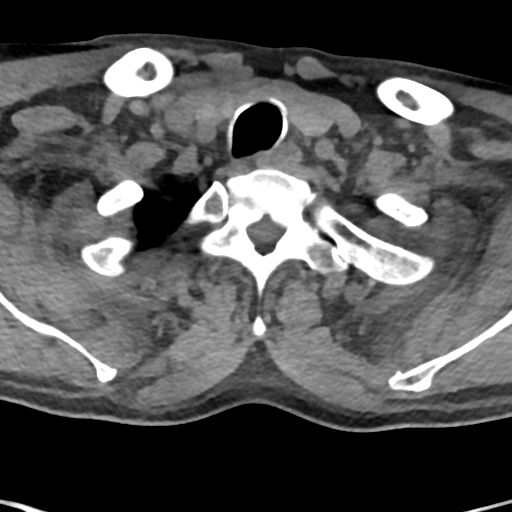
[im 19/111  bone]
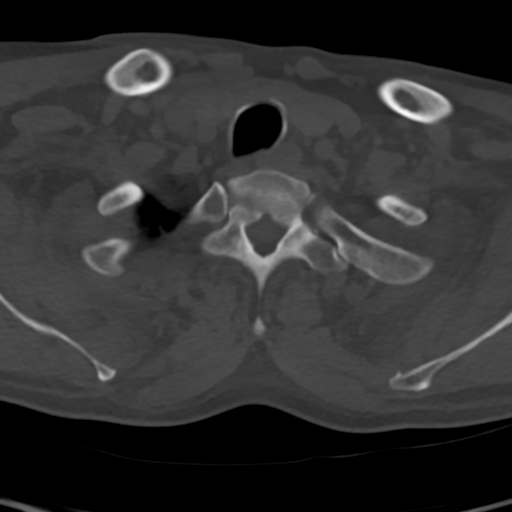
[im 37/111  bone]
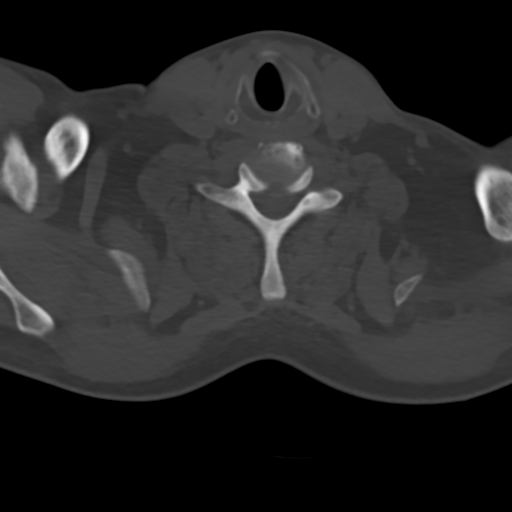
[im 74/111  bone]
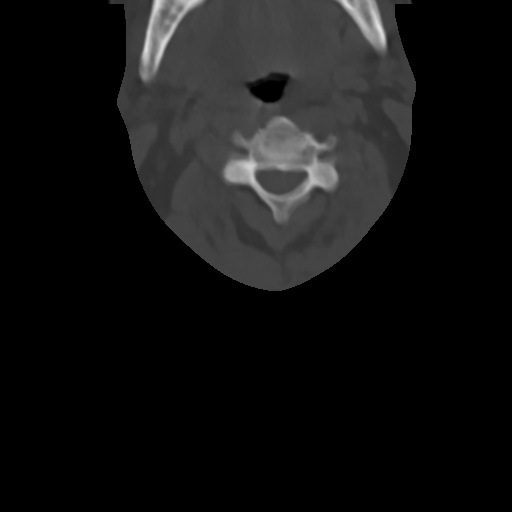
[im 92/111  bone]
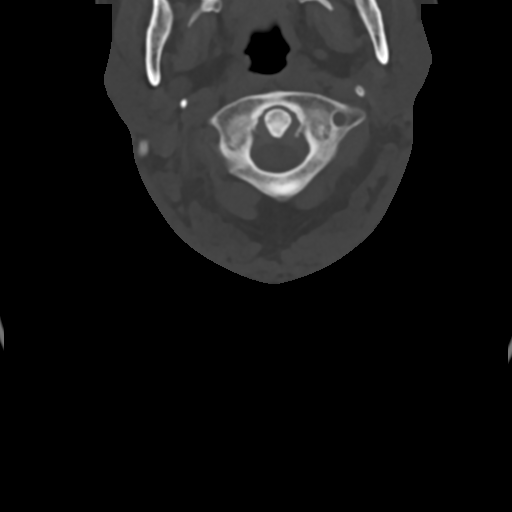

[Series 8: c_spine 2.0 sag bone · sagittal · 0.33mm/px · 5 of 61 slices shown, 6 images]
[im 21/61  bone]
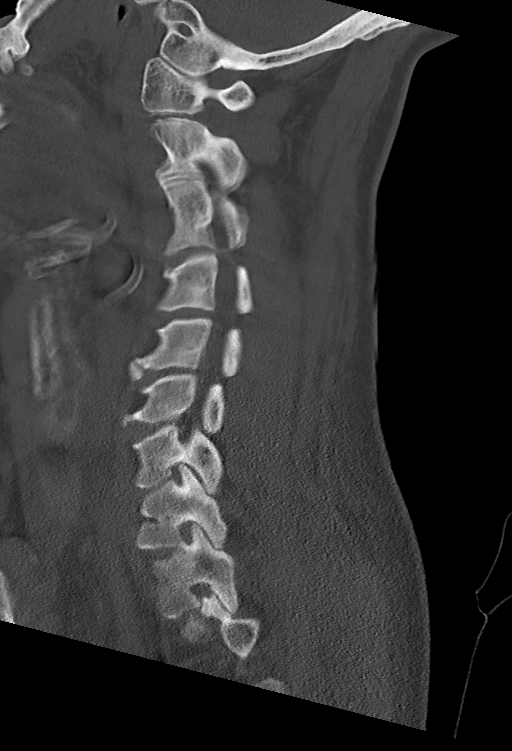
[im 26/61  bone]
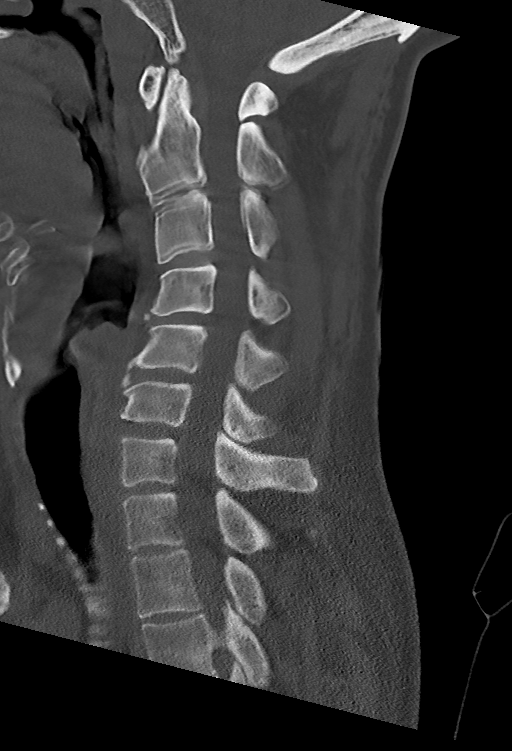
[im 31/61  soft-tissue]
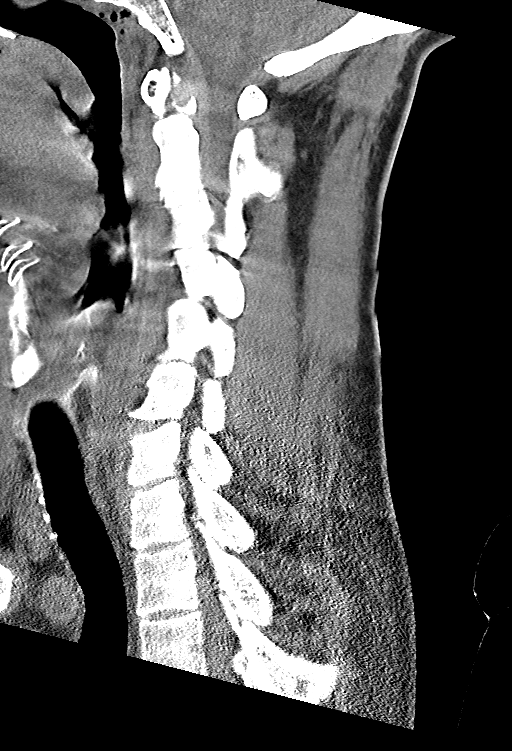
[im 31/61  bone]
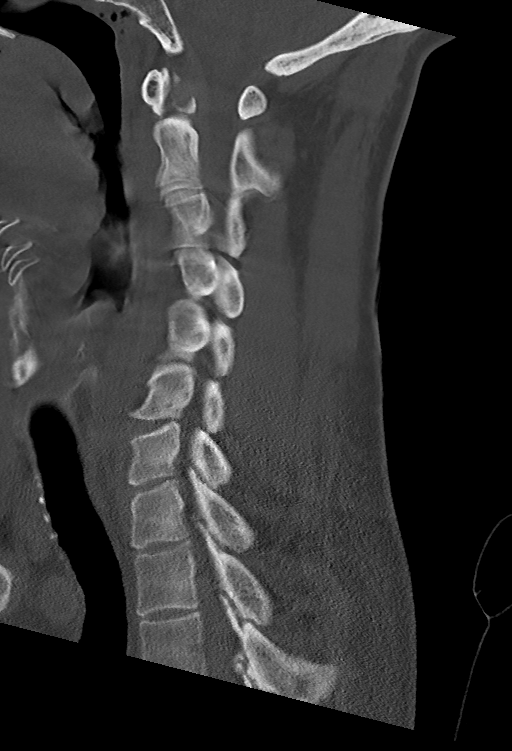
[im 36/61  bone]
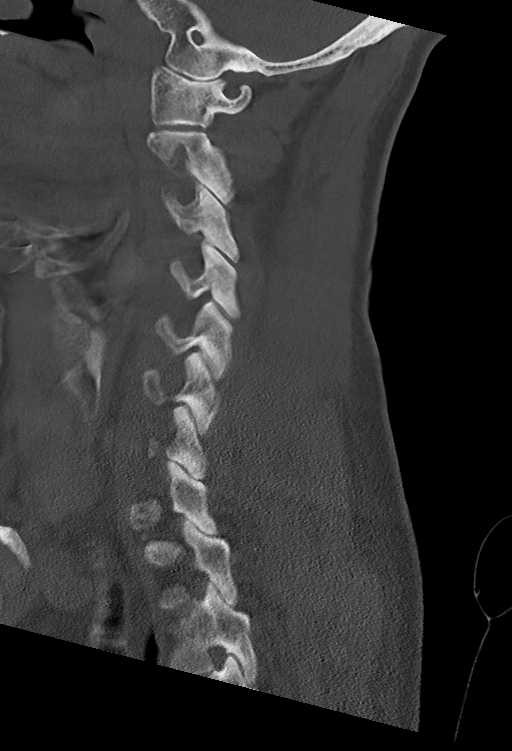
[im 41/61  bone]
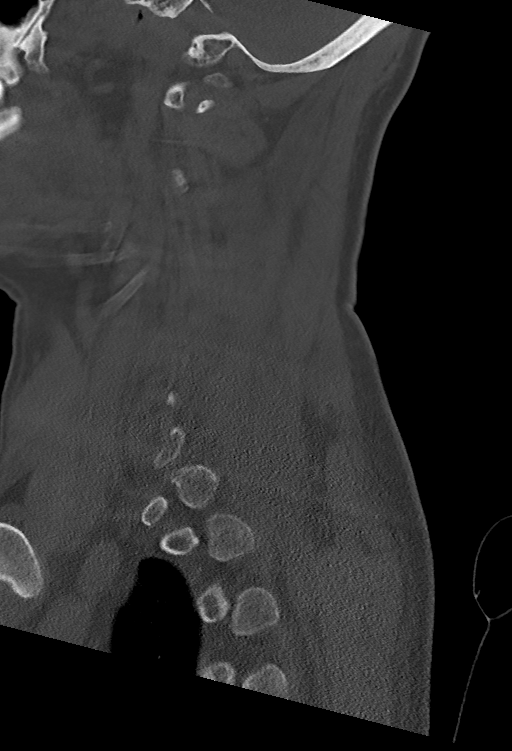

[Series 9: c_spine 2.0 cor bone · coronal · 0.33mm/px · 3 of 71 slices shown]
[im 15/71  bone]
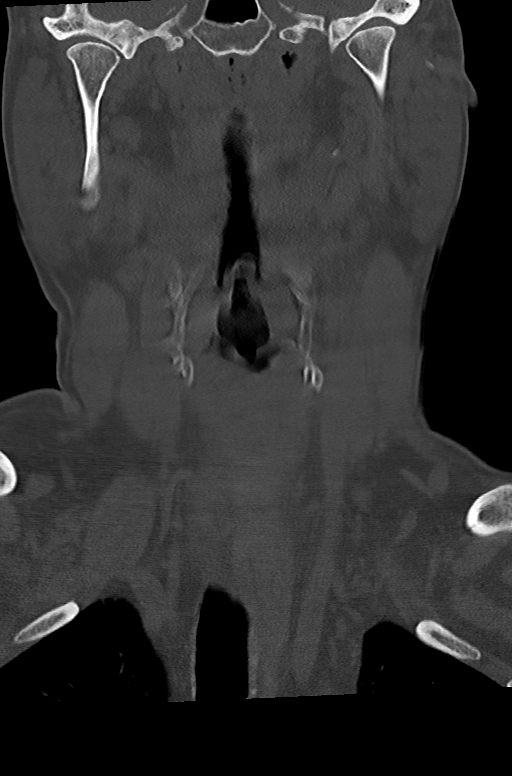
[im 29/71  bone]
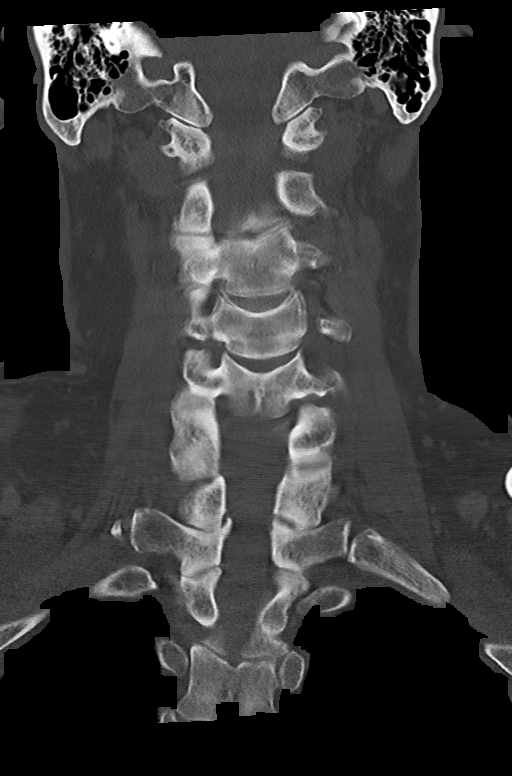
[im 43/71  bone]
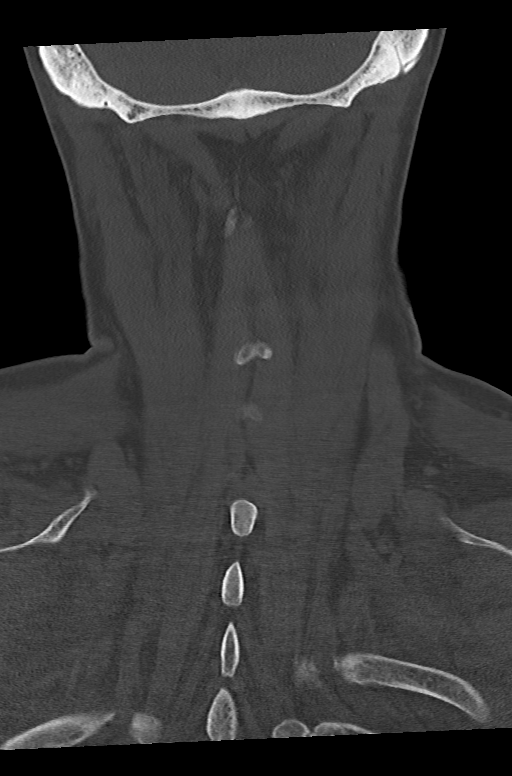

[12 of 33 positions shown; findings below may reference images not displayed]

FINDINGS: CT HEAD FINDINGS

Brain: There is no evidence of an acute infarct, intracranial
hemorrhage, mass, midline shift, or extra-axial fluid collection.
There is mild cerebral atrophy.

Vascular: No hyperdense vessel.

Skull: No fracture or suspicious osseous lesion.

Sinuses/Orbits: Visualized paranasal sinuses and mastoid air cells
are clear. Unremarkable orbits.

Other: Moderate occipital scalp hematoma primarily to the left of
midline.

CT CERVICAL SPINE FINDINGS

The study is motion degraded including moderate to severe motion
artifact through the C2-3 level.

Alignment: Reversal of the normal cervical lordosis. No evidence of
acute traumatic malalignment.

Skull base and vertebrae: No acute fracture is identified, however
assessment is limited by motion, particularly for a nondisplaced or
minimally displaced fracture in the upper cervical spine. No
destructive osseous lesion.

Soft tissues and spinal canal: No prevertebral fluid or swelling. No
visible canal hematoma.

Disc levels:  Mild cervical spondylosis.

Upper chest: Clear lung apices.

Other: None.
IMPRESSION: 1. No evidence of acute intracranial abnormality.
2. Occipital scalp hematoma.
3. Motion degraded cervical spine CT without evidence of an acute
fracture or traumatic malalignment.
